# Patient Record
Sex: Female | Born: 1955 | Race: Black or African American | Hispanic: No | Marital: Single | Smoking: Never smoker
Health system: Southern US, Community
[De-identification: ages and names within clinical notes are randomized; demographics above are authoritative.]

## PROBLEM LIST (undated history)

## (undated) DIAGNOSIS — F32A Depression, unspecified: Secondary | ICD-10-CM

## (undated) DIAGNOSIS — E785 Hyperlipidemia, unspecified: Secondary | ICD-10-CM

## (undated) DIAGNOSIS — E119 Type 2 diabetes mellitus without complications: Secondary | ICD-10-CM

## (undated) DIAGNOSIS — C50919 Malignant neoplasm of unspecified site of unspecified female breast: Secondary | ICD-10-CM

## (undated) DIAGNOSIS — C801 Malignant (primary) neoplasm, unspecified: Secondary | ICD-10-CM

## (undated) DIAGNOSIS — I10 Essential (primary) hypertension: Secondary | ICD-10-CM

## (undated) DIAGNOSIS — M1990 Unspecified osteoarthritis, unspecified site: Secondary | ICD-10-CM

## (undated) HISTORY — PX: BREAST BIOPSY: SHX20

## (undated) HISTORY — DX: Hyperlipidemia, unspecified: E78.5

## (undated) HISTORY — DX: Essential (primary) hypertension: I10

## (undated) HISTORY — PX: BREAST LUMPECTOMY: SHX2

## (undated) HISTORY — PX: TONSILLECTOMY: SUR1361

## (undated) HISTORY — DX: Malignant neoplasm of unspecified site of unspecified female breast: C50.919

## (undated) HISTORY — DX: Depression, unspecified: F32.A

## (undated) HISTORY — PX: KNEE ARTHROSCOPY: SHX127

## (undated) HISTORY — DX: Type 2 diabetes mellitus without complications: E11.9

## (undated) HISTORY — DX: Unspecified osteoarthritis, unspecified site: M19.90

---

## 2010-12-03 DIAGNOSIS — C50811 Malignant neoplasm of overlapping sites of right female breast: Secondary | ICD-10-CM | POA: Insufficient documentation

## 2019-05-28 ENCOUNTER — Other Ambulatory Visit: Payer: Self-pay

## 2019-05-28 DIAGNOSIS — Z20822 Contact with and (suspected) exposure to covid-19: Secondary | ICD-10-CM

## 2019-05-30 LAB — NOVEL CORONAVIRUS, NAA: SARS-CoV-2, NAA: NOT DETECTED

## 2019-12-24 ENCOUNTER — Emergency Department
Admission: EM | Admit: 2019-12-24 | Discharge: 2019-12-24 | Disposition: A | Discharge status: Home / Self Care | Payer: BLUE CROSS/BLUE SHIELD | Attending: Emergency Medicine | Admitting: Emergency Medicine

## 2019-12-24 ENCOUNTER — Other Ambulatory Visit: Payer: Self-pay

## 2019-12-24 ENCOUNTER — Encounter: Payer: Self-pay | Admitting: Emergency Medicine

## 2019-12-24 ENCOUNTER — Emergency Department: Payer: BLUE CROSS/BLUE SHIELD

## 2019-12-24 DIAGNOSIS — R519 Headache, unspecified: Secondary | ICD-10-CM | POA: Insufficient documentation

## 2019-12-24 DIAGNOSIS — R42 Dizziness and giddiness: Secondary | ICD-10-CM | POA: Insufficient documentation

## 2019-12-24 HISTORY — DX: Malignant (primary) neoplasm, unspecified: C80.1

## 2019-12-24 LAB — DIFFERENTIAL
Abs Immature Granulocytes: 0.06 10*3/uL (ref 0.00–0.07)
Basophils Absolute: 0 10*3/uL (ref 0.0–0.1)
Basophils Relative: 0 %
Eosinophils Absolute: 0.2 10*3/uL (ref 0.0–0.5)
Eosinophils Relative: 2 %
Immature Granulocytes: 1 %
Lymphocytes Relative: 32 %
Lymphs Abs: 3.5 10*3/uL (ref 0.7–4.0)
Monocytes Absolute: 0.8 10*3/uL (ref 0.1–1.0)
Monocytes Relative: 7 %
Neutro Abs: 6.5 10*3/uL (ref 1.7–7.7)
Neutrophils Relative %: 58 %

## 2019-12-24 LAB — GLUCOSE, CAPILLARY
Glucose-Capillary: 192 mg/dL — ABNORMAL HIGH (ref 70–99)
Glucose-Capillary: 128 mg/dL — ABNORMAL HIGH (ref 70–99)

## 2019-12-24 LAB — COMPREHENSIVE METABOLIC PANEL
ALT: 25 U/L (ref 0–44)
AST: 22 U/L (ref 15–41)
Albumin: 3.8 g/dL (ref 3.5–5.0)
Alkaline Phosphatase: 86 U/L (ref 38–126)
Anion gap: 8 (ref 5–15)
BUN: 14 mg/dL (ref 8–23)
CO2: 23 mmol/L (ref 22–32)
Calcium: 8.4 mg/dL — ABNORMAL LOW (ref 8.9–10.3)
Chloride: 106 mmol/L (ref 98–111)
Creatinine, Ser: 1 mg/dL (ref 0.44–1.00)
GFR calc Af Amer: >60 mL/min (ref >60)
GFR calc non Af Amer: 59 mL/min — ABNORMAL LOW (ref >60)
Glucose, Bld: 200 mg/dL — ABNORMAL HIGH (ref 70–99)
Potassium: 3.6 mmol/L (ref 3.5–5.1)
Sodium: 137 mmol/L (ref 135–145)
Total Bilirubin: 0.5 mg/dL (ref 0.3–1.2)
Total Protein: 7.4 g/dL (ref 6.5–8.1)

## 2019-12-24 LAB — APTT: aPTT: 25 seconds (ref 24–36)

## 2019-12-24 LAB — CBC
HCT: 44 % (ref 36.0–46.0)
Hemoglobin: 14.2 g/dL (ref 12.0–15.0)
MCH: 30.5 pg (ref 26.0–34.0)
MCHC: 32.3 g/dL (ref 30.0–36.0)
MCV: 94.6 fL (ref 80.0–100.0)
Platelets: 319 10*3/uL (ref 150–400)
RBC: 4.65 MIL/uL (ref 3.87–5.11)
RDW: 12.6 % (ref 11.5–15.5)
WBC: 10.9 10*3/uL — ABNORMAL HIGH (ref 4.0–10.5)
nRBC: 0 % (ref 0.0–0.2)

## 2019-12-24 LAB — PROTIME-INR
INR: 0.9 (ref 0.8–1.2)
Prothrombin Time: 12.4 seconds (ref 11.4–15.2)

## 2019-12-24 MED ORDER — MECLIZINE HCL 25 MG PO TABS
25.0000 mg | ORAL_TABLET | Freq: Once | ORAL | Status: AC
  Administered 2019-12-24: 25 mg via ORAL
  Filled 2019-12-24: qty 1

## 2019-12-24 MED ORDER — MECLIZINE HCL 25 MG PO TABS: 25.0000 mg | ORAL_TABLET | Freq: Three times a day (TID) | ORAL | 0 refills | Status: AC | PRN

## 2019-12-24 MED ORDER — IBUPROFEN 400 MG PO TABS
400.0000 mg | ORAL_TABLET | Freq: Once | ORAL | Status: AC
  Administered 2019-12-24: 400 mg via ORAL
  Filled 2019-12-24: qty 1

## 2019-12-24 MED ORDER — SODIUM CHLORIDE 0.9% FLUSH: 3.0000 mL | Freq: Once | INTRAVENOUS | Status: DC

## 2019-12-24 NOTE — ED Notes (Signed)
Pt reports improvement of dizziness following medication administration. Patient is resting comfortably in bed at this time and has no complaints or requests

## 2019-12-24 NOTE — ED Triage Notes (Signed)
Pt to ED via POV c/o "vertigo". Pt states that she does not have hx/o vertigo. Pt states that she got up yesterday morning and was sitting on the toilet. Pt states that the room started to spin, she felt nauseated, and got a headache. Pt states that when she stood up she was off balance. Pt reports taking Dramamine with improvement in the nausea and some improvement in the dizziness. Pt states that she still has a headache. Pt is in NAD at this time. Pt denies numbness or tingling. States that she did have some numbness on the left side of her face yesterday but that has since resolved, pt reports that numbness "did not last long". No slurred speech noted.

## 2019-12-24 NOTE — ED Provider Notes (Signed)
[facility] Emergency Department Provider Note  Time seen: 7:04 PM  I have reviewed the triage vital signs and the nursing notes.   HISTORY  Chief Complaint Dizziness   HPI Krista Collins is a 64 y.o. female presents to the emergency department for dizziness.  According to the patient yesterday she sat down on the toilet and had acute onset of dizziness which she describes as the room spinning around her.  Patient states she became quite nauseated as well.  States throughout the day had come and gone she states it gets worse anytime she moves her head or turns her head.  She took Dramamine today which has been helping with her symptoms, but she continues to have intermittent dizziness especially with head movement so she came to the emergency department for evaluation.  Patient denies any weakness or numbness of any arm or leg denies any slurred speech.  No history of vertigo previously.  Patient states mild headache as well.  Past Medical History:  Diagnosis Date  . Cancer ([place])     There are no problems to display for this patient.   Past Surgical History:  Procedure Laterality Date  . BREAST LUMPECTOMY Right     Prior to Admission medications   Not on File    No Known Allergies  No family history on file.  Social History Social History   Tobacco Use  . Smoking status: Never Smoker  . Smokeless tobacco: Never Used  Substance Use Topics  . Alcohol use: Not Currently  . Drug use: Not Currently    Review of Systems Constitutional: Negative for fever. Cardiovascular: Negative for chest pain. Respiratory: Negative for shortness of breath. Gastrointestinal: Negative for abdominal pain, intermittent nausea with dizziness symptoms Genitourinary: Negative for urinary compaints Musculoskeletal: Negative for musculoskeletal complaints Neurological: Negative for headache All other ROS  negative  ____________________________________________   PHYSICAL EXAM:  VITAL SIGNS: ED Triage Vitals  Enc Vitals Group     BP 12/24/19 1531 (!) 157/70     Pulse Rate 12/24/19 1531 (!) 103     Resp 12/24/19 1531 18     Temp 12/24/19 1531 98.7 F (37.1 C)     Temp Source 12/24/19 1531 Oral     SpO2 12/24/19 1531 98 %     Weight 12/24/19 1537 260 lb (117.9 kg)     Height 12/24/19 1537 5\' 7"  (1.702 m)     Head Circumference --      Peak Flow --      Pain Score 12/24/19 1536 5     Pain Loc --      Pain Edu? --      Excl. in [place]? --    Constitutional: Alert and oriented. Well appearing and in no distress. Eyes: Normal exam ENT      Head: Normocephalic and atraumatic.      Mouth/Throat: Mucous membranes are moist. Cardiovascular: Normal rate, regular rhythm.  Respiratory: Normal respiratory effort without tachypnea nor retractions. Breath sounds are clear Gastrointestinal: Soft and nontender. No distention. Musculoskeletal: Nontender with normal range of motion in all extremities.  Neurologic:  Normal speech and language.  No gross deficits.  Equal grip strength bilaterally.  No pronator drift.  No lower extremity drift.  5/5 motor in all extremities.  Intact finger-to-nose testing. Skin:  Skin is warm, dry and intact.  Psychiatric: Mood and affect are normal.  ____________________________________________    EKG  EKG viewed and interpreted by myself shows a normal sinus rhythm  100 bpm.  Narrow QRS, normal axis, normal intervals, no concerning ST changes.  ____________________________________________    RADIOLOGY  CT head negative  ____________________________________________   INITIAL IMPRESSION / ASSESSMENT AND PLAN / ED COURSE  Pertinent labs & imaging results that were available during my care of the patient were reviewed by me and considered in my medical decision making (see chart for details).   Patient presents to the emergency department for acute onset  of dizziness.  Patient states it started yesterday upon sitting down on the toilet.  Describes as a spinning sensation with the room spinning around her and feeling off balance if she is trying to walk.  States it mostly occurs when she turns her head.  Has been using Dramamine today which has been helping with her symptoms.  Denies any significant dizziness lying still in bed.  Symptoms are very suggestive of BPPV.  Patient's work-up is thus far reassuring including a negative CT scan.  Patient does have a history of arthritis and hypertension.  Does not appear to be on any medications for diabetes.  Never diagnosed with diabetes.  Blood glucose is somewhat elevated, possibly indicating new onset or borderline diabetes.  We will recheck a fingerstick.  Given the patient's reassuring work-up and neurologic exam I believe the patient will be safe for discharge home with meclizine  Patient states he feels much better after meclizine.  We will discharge the patient home with PCP follow-up.  I discussed return precautions.  Patient agreeable to plan of care.  Krista Collins was evaluated in Emergency Department on 12/24/2019 for the symptoms described in the history of present illness. She was evaluated in the context of the global COVID-19 pandemic, which necessitated consideration that the patient might be at risk for infection with the SARS-CoV-2 virus that causes COVID-19. Institutional protocols and algorithms that pertain to the evaluation of patients at risk for COVID-19 are in a state of rapid change based on information released by regulatory bodies including the CDC and federal and state organizations. These policies and algorithms were followed during the patient's care in the ED.  ____________________________________________   FINAL CLINICAL IMPRESSION(S) / ED DIAGNOSES  Dizziness Vertigo   Harvest Dark, MD 12/24/19 2009

## 2019-12-24 NOTE — ED Notes (Signed)
POC CBG completed. Result was 192. Pt stating that 2-3 years ago her primary doctor told her she was border-line diabetic.

## 2020-01-31 DIAGNOSIS — M1990 Unspecified osteoarthritis, unspecified site: Secondary | ICD-10-CM | POA: Insufficient documentation

## 2020-01-31 DIAGNOSIS — G5600 Carpal tunnel syndrome, unspecified upper limb: Secondary | ICD-10-CM | POA: Insufficient documentation

## 2020-01-31 DIAGNOSIS — I10 Essential (primary) hypertension: Secondary | ICD-10-CM | POA: Insufficient documentation

## 2020-10-16 DIAGNOSIS — Z96652 Presence of left artificial knee joint: Secondary | ICD-10-CM | POA: Diagnosis not present

## 2020-10-19 DIAGNOSIS — Z96652 Presence of left artificial knee joint: Secondary | ICD-10-CM | POA: Diagnosis not present

## 2020-10-23 DIAGNOSIS — Z96652 Presence of left artificial knee joint: Secondary | ICD-10-CM | POA: Diagnosis not present

## 2020-11-06 DIAGNOSIS — Z96652 Presence of left artificial knee joint: Secondary | ICD-10-CM | POA: Diagnosis not present

## 2020-11-07 DIAGNOSIS — Z09 Encounter for follow-up examination after completed treatment for conditions other than malignant neoplasm: Secondary | ICD-10-CM | POA: Diagnosis not present

## 2020-11-07 DIAGNOSIS — Z471 Aftercare following joint replacement surgery: Secondary | ICD-10-CM | POA: Diagnosis not present

## 2020-11-07 DIAGNOSIS — Z96652 Presence of left artificial knee joint: Secondary | ICD-10-CM | POA: Diagnosis not present

## 2020-11-07 DIAGNOSIS — M21162 Varus deformity, not elsewhere classified, left knee: Secondary | ICD-10-CM | POA: Diagnosis not present

## 2020-11-15 DIAGNOSIS — Z96652 Presence of left artificial knee joint: Secondary | ICD-10-CM | POA: Diagnosis not present

## 2020-11-21 DIAGNOSIS — Z96652 Presence of left artificial knee joint: Secondary | ICD-10-CM | POA: Diagnosis not present

## 2020-11-24 DIAGNOSIS — Z96652 Presence of left artificial knee joint: Secondary | ICD-10-CM | POA: Diagnosis not present

## 2020-11-30 DIAGNOSIS — Z96652 Presence of left artificial knee joint: Secondary | ICD-10-CM | POA: Diagnosis not present

## 2020-12-11 DIAGNOSIS — M79604 Pain in right leg: Secondary | ICD-10-CM | POA: Diagnosis not present

## 2020-12-11 DIAGNOSIS — E119 Type 2 diabetes mellitus without complications: Secondary | ICD-10-CM | POA: Insufficient documentation

## 2020-12-11 DIAGNOSIS — E1140 Type 2 diabetes mellitus with diabetic neuropathy, unspecified: Secondary | ICD-10-CM | POA: Diagnosis not present

## 2020-12-11 DIAGNOSIS — R202 Paresthesia of skin: Secondary | ICD-10-CM | POA: Diagnosis not present

## 2020-12-11 DIAGNOSIS — L600 Ingrowing nail: Secondary | ICD-10-CM | POA: Diagnosis not present

## 2020-12-11 DIAGNOSIS — M79675 Pain in left toe(s): Secondary | ICD-10-CM | POA: Diagnosis not present

## 2020-12-11 DIAGNOSIS — M79674 Pain in right toe(s): Secondary | ICD-10-CM | POA: Diagnosis not present

## 2020-12-11 DIAGNOSIS — M545 Low back pain, unspecified: Secondary | ICD-10-CM | POA: Diagnosis not present

## 2020-12-11 DIAGNOSIS — B351 Tinea unguium: Secondary | ICD-10-CM | POA: Diagnosis not present

## 2020-12-11 DIAGNOSIS — Z780 Asymptomatic menopausal state: Secondary | ICD-10-CM | POA: Diagnosis not present

## 2020-12-13 DIAGNOSIS — M1711 Unilateral primary osteoarthritis, right knee: Secondary | ICD-10-CM | POA: Diagnosis not present

## 2021-01-04 DIAGNOSIS — Z96652 Presence of left artificial knee joint: Secondary | ICD-10-CM | POA: Diagnosis not present

## 2021-01-05 DIAGNOSIS — M9905 Segmental and somatic dysfunction of pelvic region: Secondary | ICD-10-CM | POA: Diagnosis not present

## 2021-01-05 DIAGNOSIS — M5431 Sciatica, right side: Secondary | ICD-10-CM | POA: Diagnosis not present

## 2021-01-05 DIAGNOSIS — M9903 Segmental and somatic dysfunction of lumbar region: Secondary | ICD-10-CM | POA: Diagnosis not present

## 2021-01-05 DIAGNOSIS — M5432 Sciatica, left side: Secondary | ICD-10-CM | POA: Diagnosis not present

## 2021-01-08 DIAGNOSIS — M5431 Sciatica, right side: Secondary | ICD-10-CM | POA: Diagnosis not present

## 2021-01-08 DIAGNOSIS — M9905 Segmental and somatic dysfunction of pelvic region: Secondary | ICD-10-CM | POA: Diagnosis not present

## 2021-01-08 DIAGNOSIS — M9903 Segmental and somatic dysfunction of lumbar region: Secondary | ICD-10-CM | POA: Diagnosis not present

## 2021-01-08 DIAGNOSIS — M5432 Sciatica, left side: Secondary | ICD-10-CM | POA: Diagnosis not present

## 2021-01-12 DIAGNOSIS — M9903 Segmental and somatic dysfunction of lumbar region: Secondary | ICD-10-CM | POA: Diagnosis not present

## 2021-01-12 DIAGNOSIS — M5431 Sciatica, right side: Secondary | ICD-10-CM | POA: Diagnosis not present

## 2021-01-12 DIAGNOSIS — M5432 Sciatica, left side: Secondary | ICD-10-CM | POA: Diagnosis not present

## 2021-01-12 DIAGNOSIS — M9905 Segmental and somatic dysfunction of pelvic region: Secondary | ICD-10-CM | POA: Diagnosis not present

## 2021-01-24 DIAGNOSIS — M5432 Sciatica, left side: Secondary | ICD-10-CM | POA: Diagnosis not present

## 2021-01-24 DIAGNOSIS — M9903 Segmental and somatic dysfunction of lumbar region: Secondary | ICD-10-CM | POA: Diagnosis not present

## 2021-01-24 DIAGNOSIS — M9905 Segmental and somatic dysfunction of pelvic region: Secondary | ICD-10-CM | POA: Diagnosis not present

## 2021-01-24 DIAGNOSIS — M5431 Sciatica, right side: Secondary | ICD-10-CM | POA: Diagnosis not present

## 2021-01-25 DIAGNOSIS — Z780 Asymptomatic menopausal state: Secondary | ICD-10-CM | POA: Diagnosis not present

## 2021-01-25 DIAGNOSIS — R202 Paresthesia of skin: Secondary | ICD-10-CM | POA: Diagnosis not present

## 2021-01-25 DIAGNOSIS — E119 Type 2 diabetes mellitus without complications: Secondary | ICD-10-CM | POA: Diagnosis not present

## 2021-01-25 DIAGNOSIS — E559 Vitamin D deficiency, unspecified: Secondary | ICD-10-CM | POA: Diagnosis not present

## 2021-01-29 DIAGNOSIS — M9905 Segmental and somatic dysfunction of pelvic region: Secondary | ICD-10-CM | POA: Diagnosis not present

## 2021-01-29 DIAGNOSIS — M9903 Segmental and somatic dysfunction of lumbar region: Secondary | ICD-10-CM | POA: Diagnosis not present

## 2021-01-29 DIAGNOSIS — M5432 Sciatica, left side: Secondary | ICD-10-CM | POA: Diagnosis not present

## 2021-01-29 DIAGNOSIS — Z124 Encounter for screening for malignant neoplasm of cervix: Secondary | ICD-10-CM | POA: Diagnosis not present

## 2021-01-29 DIAGNOSIS — M5431 Sciatica, right side: Secondary | ICD-10-CM | POA: Diagnosis not present

## 2021-01-29 DIAGNOSIS — Z0000 Encounter for general adult medical examination without abnormal findings: Secondary | ICD-10-CM | POA: Diagnosis not present

## 2021-01-29 DIAGNOSIS — E1142 Type 2 diabetes mellitus with diabetic polyneuropathy: Secondary | ICD-10-CM | POA: Diagnosis not present

## 2021-02-02 DIAGNOSIS — M9905 Segmental and somatic dysfunction of pelvic region: Secondary | ICD-10-CM | POA: Diagnosis not present

## 2021-02-02 DIAGNOSIS — M9903 Segmental and somatic dysfunction of lumbar region: Secondary | ICD-10-CM | POA: Diagnosis not present

## 2021-02-02 DIAGNOSIS — M5432 Sciatica, left side: Secondary | ICD-10-CM | POA: Diagnosis not present

## 2021-02-02 DIAGNOSIS — M5431 Sciatica, right side: Secondary | ICD-10-CM | POA: Diagnosis not present

## 2021-02-12 IMAGING — CT CT HEAD W/O CM
3 series · 16 of 47 positions shown, 19 images · non-contrast
Comparison: None.

CLINICAL DATA: Dizziness, nausea

EXAM:
CT HEAD WITHOUT CONTRAST
TECHNIQUE: Contiguous axial images were obtained from the base of the skull
through the vertex without intravenous contrast.

[Series 2: head wo · axial · 0.43mm/px · z∈[+283,+408]mm · 10 of 30 slices shown, 13 images]
[im 3/30  brain]
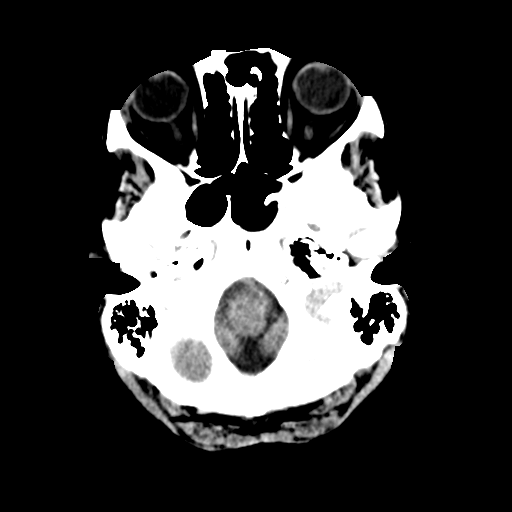
[im 3/30  bone]
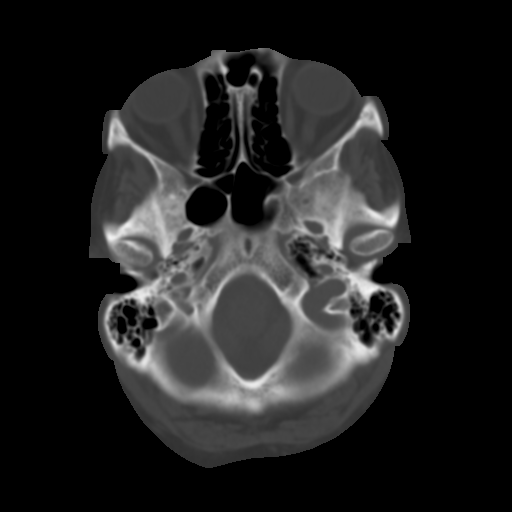
[im 6/30  brain]
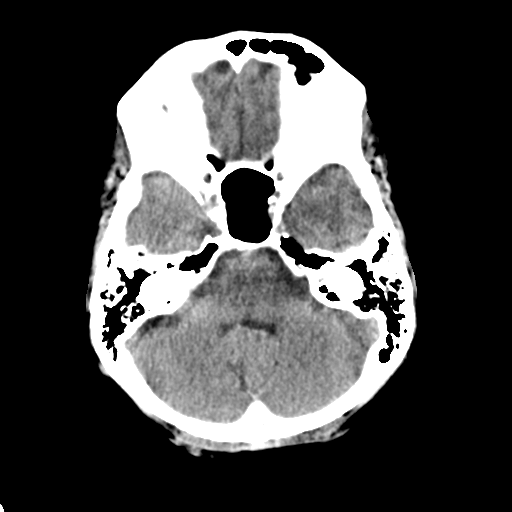
[im 9/30  brain]
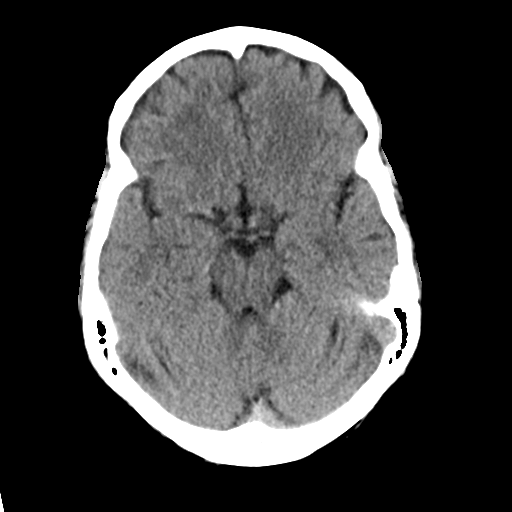
[im 11/30  brain]
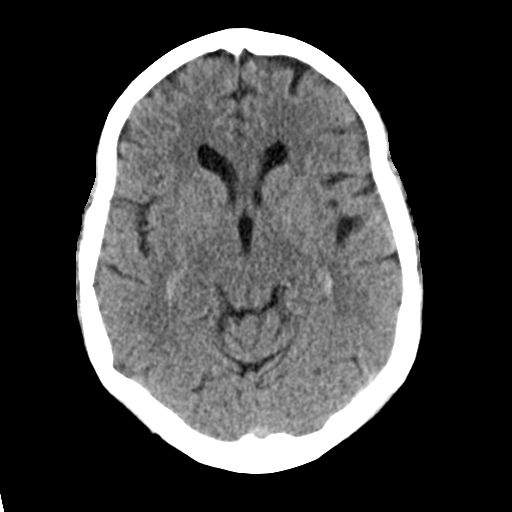
[im 14/30  brain]
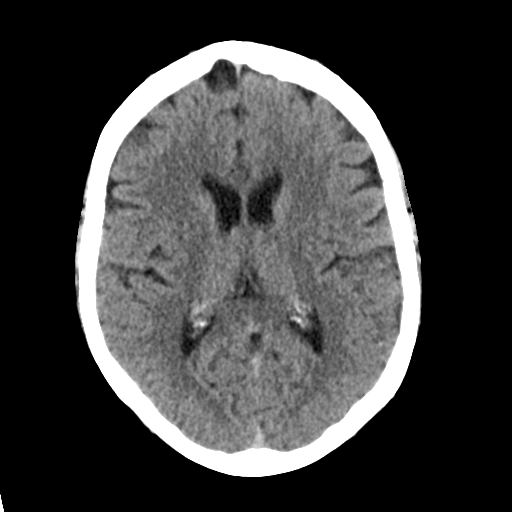
[im 14/30  bone]
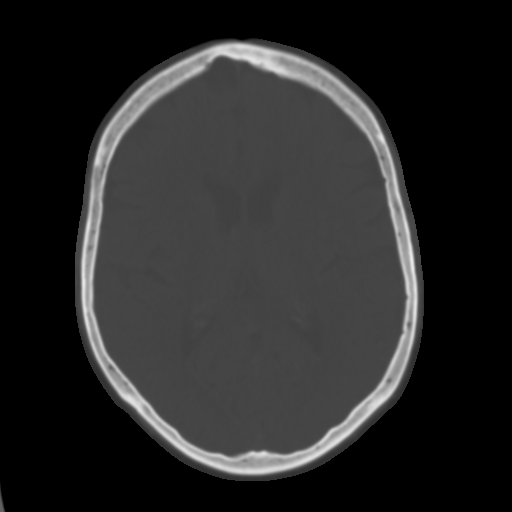
[im 17/30  brain]
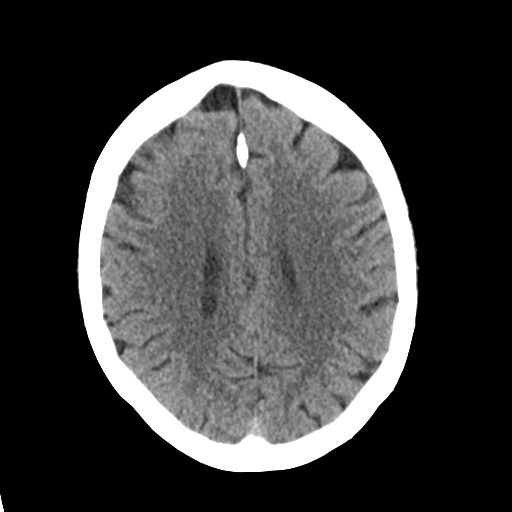
[im 20/30  brain]
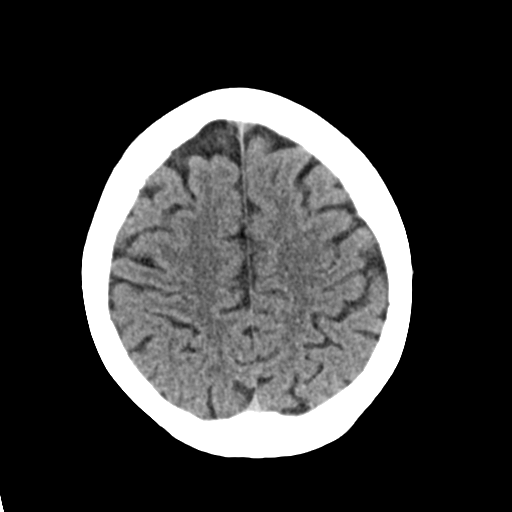
[im 23/30  brain]
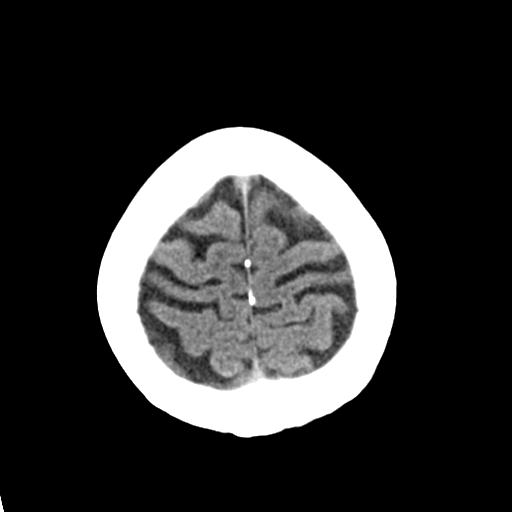
[im 25/30  brain]
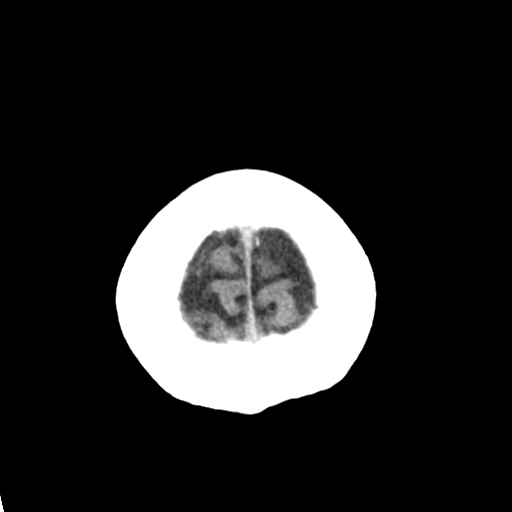
[im 25/30  bone]
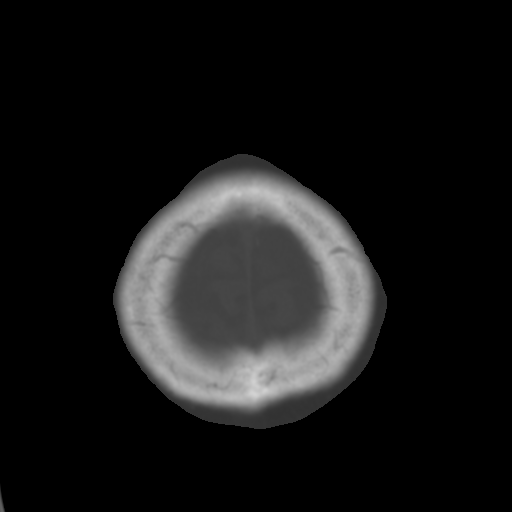
[im 28/30  brain]
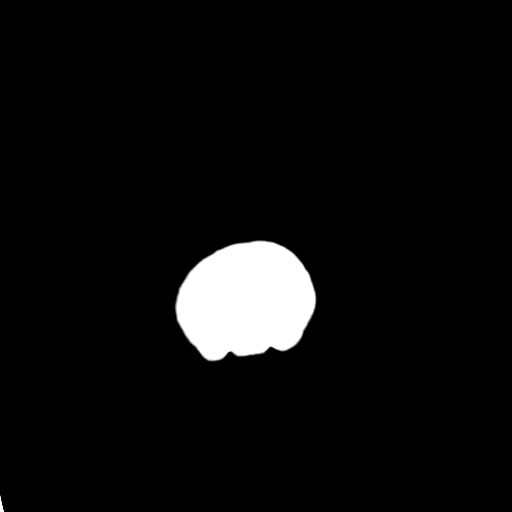

[Series 4: coronal soft tissue · coronal · 0.30mm/px · 3 of 67 slices shown]
[im 23/67  brain]
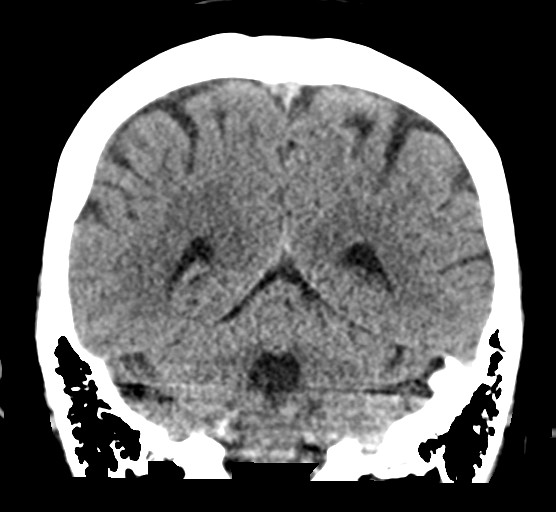
[im 30/67  brain]
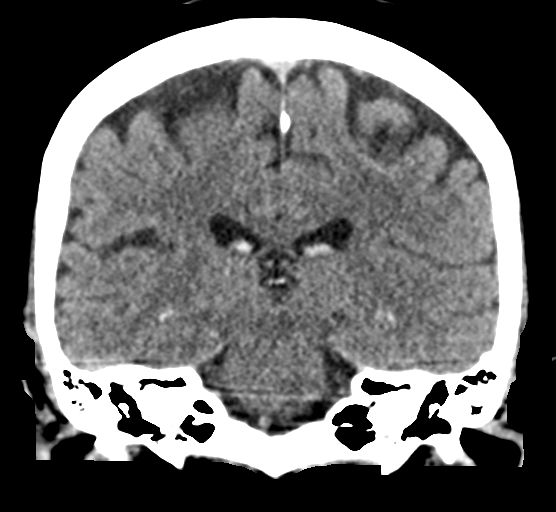
[im 37/67  brain]
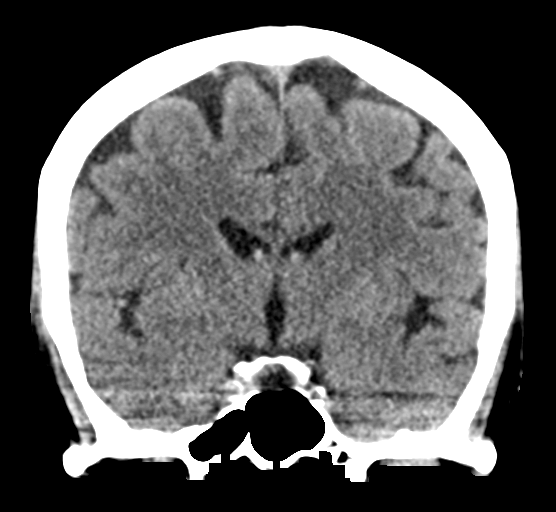

[Series 5: sagittal soft tissue · sagittal · 0.29mm/px · 3 of 61 slices shown]
[im 21/61  brain]
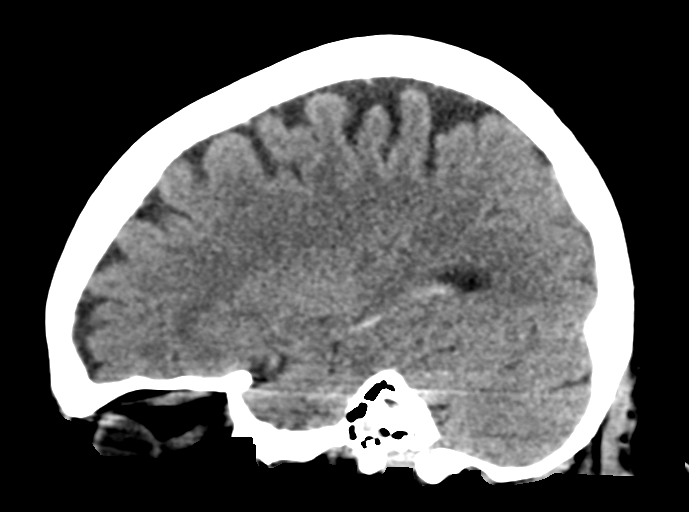
[im 31/61  brain]
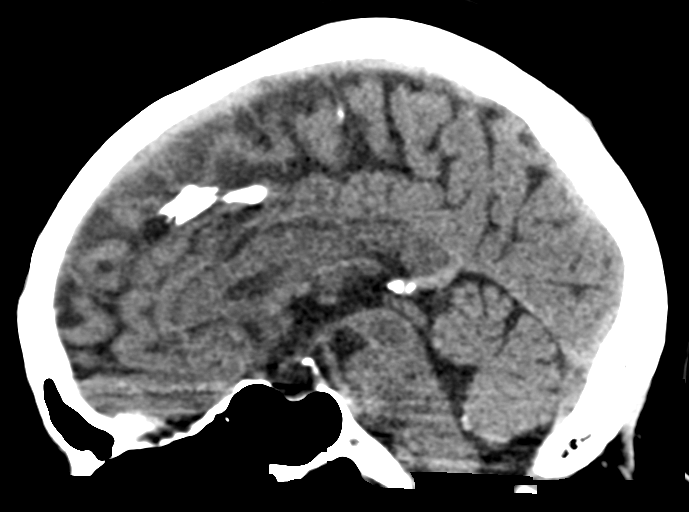
[im 41/61  brain]
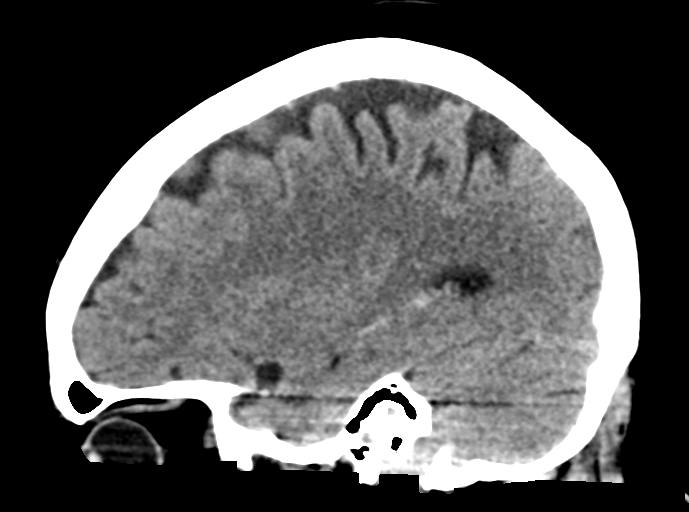

[16 of 47 positions shown; findings below may reference images not displayed]

FINDINGS: Brain: No evidence of acute infarction, hemorrhage, hydrocephalus,
extra-axial collection or mass lesion/mass effect.

Vascular: No hyperdense vessel or unexpected calcification.

Skull: Normal. Negative for fracture or focal lesion.

Sinuses/Orbits: No acute finding.

Other: None.
IMPRESSION: No acute intracranial pathology.

## 2021-02-15 DIAGNOSIS — F324 Major depressive disorder, single episode, in partial remission: Secondary | ICD-10-CM | POA: Diagnosis not present

## 2021-02-15 DIAGNOSIS — R600 Localized edema: Secondary | ICD-10-CM | POA: Diagnosis not present

## 2021-03-06 DIAGNOSIS — Z20822 Contact with and (suspected) exposure to covid-19: Secondary | ICD-10-CM | POA: Diagnosis not present

## 2021-03-13 DIAGNOSIS — L600 Ingrowing nail: Secondary | ICD-10-CM | POA: Diagnosis not present

## 2021-03-13 DIAGNOSIS — L03032 Cellulitis of left toe: Secondary | ICD-10-CM | POA: Diagnosis not present

## 2021-03-13 DIAGNOSIS — E1140 Type 2 diabetes mellitus with diabetic neuropathy, unspecified: Secondary | ICD-10-CM | POA: Diagnosis not present

## 2021-03-13 DIAGNOSIS — B351 Tinea unguium: Secondary | ICD-10-CM | POA: Diagnosis not present

## 2021-03-16 DIAGNOSIS — M1711 Unilateral primary osteoarthritis, right knee: Secondary | ICD-10-CM | POA: Diagnosis not present

## 2021-04-18 DIAGNOSIS — H524 Presbyopia: Secondary | ICD-10-CM | POA: Diagnosis not present

## 2021-04-18 DIAGNOSIS — Z0100 Encounter for examination of eyes and vision without abnormal findings: Secondary | ICD-10-CM | POA: Diagnosis not present

## 2021-06-14 DIAGNOSIS — M1711 Unilateral primary osteoarthritis, right knee: Secondary | ICD-10-CM | POA: Diagnosis not present

## 2021-07-02 DIAGNOSIS — Z23 Encounter for immunization: Secondary | ICD-10-CM | POA: Diagnosis not present

## 2021-07-02 DIAGNOSIS — M1711 Unilateral primary osteoarthritis, right knee: Secondary | ICD-10-CM | POA: Diagnosis not present

## 2021-07-02 DIAGNOSIS — J0100 Acute maxillary sinusitis, unspecified: Secondary | ICD-10-CM | POA: Diagnosis not present

## 2021-07-12 DIAGNOSIS — Z1231 Encounter for screening mammogram for malignant neoplasm of breast: Secondary | ICD-10-CM | POA: Diagnosis not present

## 2021-08-07 DIAGNOSIS — G8929 Other chronic pain: Secondary | ICD-10-CM | POA: Diagnosis not present

## 2021-08-07 DIAGNOSIS — M25561 Pain in right knee: Secondary | ICD-10-CM | POA: Diagnosis not present

## 2021-08-07 DIAGNOSIS — M1711 Unilateral primary osteoarthritis, right knee: Secondary | ICD-10-CM | POA: Insufficient documentation

## 2021-08-07 DIAGNOSIS — Z96652 Presence of left artificial knee joint: Secondary | ICD-10-CM | POA: Diagnosis not present

## 2021-10-02 DIAGNOSIS — Z6838 Body mass index (BMI) 38.0-38.9, adult: Secondary | ICD-10-CM | POA: Diagnosis not present

## 2021-10-02 DIAGNOSIS — Z5181 Encounter for therapeutic drug level monitoring: Secondary | ICD-10-CM | POA: Diagnosis not present

## 2021-10-02 DIAGNOSIS — R7309 Other abnormal glucose: Secondary | ICD-10-CM | POA: Diagnosis not present

## 2021-10-02 DIAGNOSIS — M1711 Unilateral primary osteoarthritis, right knee: Secondary | ICD-10-CM | POA: Diagnosis not present

## 2021-10-02 DIAGNOSIS — Z7901 Long term (current) use of anticoagulants: Secondary | ICD-10-CM | POA: Diagnosis not present

## 2021-10-02 DIAGNOSIS — Z01818 Encounter for other preprocedural examination: Secondary | ICD-10-CM | POA: Diagnosis not present

## 2021-10-18 DIAGNOSIS — M1711 Unilateral primary osteoarthritis, right knee: Secondary | ICD-10-CM | POA: Insufficient documentation

## 2021-10-19 DIAGNOSIS — M1711 Unilateral primary osteoarthritis, right knee: Secondary | ICD-10-CM | POA: Diagnosis not present

## 2021-10-22 DIAGNOSIS — M1711 Unilateral primary osteoarthritis, right knee: Secondary | ICD-10-CM | POA: Diagnosis not present

## 2021-10-22 DIAGNOSIS — Z923 Personal history of irradiation: Secondary | ICD-10-CM | POA: Diagnosis not present

## 2021-10-22 DIAGNOSIS — E119 Type 2 diabetes mellitus without complications: Secondary | ICD-10-CM | POA: Diagnosis not present

## 2021-10-22 DIAGNOSIS — Z853 Personal history of malignant neoplasm of breast: Secondary | ICD-10-CM | POA: Diagnosis not present

## 2021-10-22 DIAGNOSIS — Z96651 Presence of right artificial knee joint: Secondary | ICD-10-CM | POA: Diagnosis not present

## 2021-10-22 DIAGNOSIS — Z8616 Personal history of COVID-19: Secondary | ICD-10-CM | POA: Diagnosis not present

## 2021-10-22 DIAGNOSIS — Z7982 Long term (current) use of aspirin: Secondary | ICD-10-CM | POA: Diagnosis not present

## 2021-10-22 DIAGNOSIS — Z79899 Other long term (current) drug therapy: Secondary | ICD-10-CM | POA: Diagnosis not present

## 2021-10-22 DIAGNOSIS — I10 Essential (primary) hypertension: Secondary | ICD-10-CM | POA: Diagnosis not present

## 2021-10-22 DIAGNOSIS — Z96652 Presence of left artificial knee joint: Secondary | ICD-10-CM | POA: Diagnosis not present

## 2021-10-23 DIAGNOSIS — E119 Type 2 diabetes mellitus without complications: Secondary | ICD-10-CM | POA: Diagnosis not present

## 2021-10-23 DIAGNOSIS — Z853 Personal history of malignant neoplasm of breast: Secondary | ICD-10-CM | POA: Diagnosis not present

## 2021-10-23 DIAGNOSIS — I10 Essential (primary) hypertension: Secondary | ICD-10-CM | POA: Diagnosis not present

## 2021-10-23 DIAGNOSIS — Z8616 Personal history of COVID-19: Secondary | ICD-10-CM | POA: Diagnosis not present

## 2021-10-23 DIAGNOSIS — M1711 Unilateral primary osteoarthritis, right knee: Secondary | ICD-10-CM | POA: Diagnosis not present

## 2021-10-23 DIAGNOSIS — Z7982 Long term (current) use of aspirin: Secondary | ICD-10-CM | POA: Diagnosis not present

## 2021-10-23 DIAGNOSIS — Z923 Personal history of irradiation: Secondary | ICD-10-CM | POA: Diagnosis not present

## 2021-10-23 DIAGNOSIS — Z79899 Other long term (current) drug therapy: Secondary | ICD-10-CM | POA: Diagnosis not present

## 2021-10-23 DIAGNOSIS — E1165 Type 2 diabetes mellitus with hyperglycemia: Secondary | ICD-10-CM | POA: Diagnosis not present

## 2021-10-23 DIAGNOSIS — Z96652 Presence of left artificial knee joint: Secondary | ICD-10-CM | POA: Diagnosis not present

## 2021-10-24 DIAGNOSIS — E119 Type 2 diabetes mellitus without complications: Secondary | ICD-10-CM | POA: Diagnosis not present

## 2021-10-24 DIAGNOSIS — Z7982 Long term (current) use of aspirin: Secondary | ICD-10-CM | POA: Diagnosis not present

## 2021-10-24 DIAGNOSIS — Z853 Personal history of malignant neoplasm of breast: Secondary | ICD-10-CM | POA: Diagnosis not present

## 2021-10-24 DIAGNOSIS — I10 Essential (primary) hypertension: Secondary | ICD-10-CM | POA: Diagnosis not present

## 2021-10-24 DIAGNOSIS — Z79899 Other long term (current) drug therapy: Secondary | ICD-10-CM | POA: Diagnosis not present

## 2021-10-24 DIAGNOSIS — M1711 Unilateral primary osteoarthritis, right knee: Secondary | ICD-10-CM | POA: Diagnosis not present

## 2021-10-24 DIAGNOSIS — Z8616 Personal history of COVID-19: Secondary | ICD-10-CM | POA: Diagnosis not present

## 2021-10-24 DIAGNOSIS — Z923 Personal history of irradiation: Secondary | ICD-10-CM | POA: Diagnosis not present

## 2021-10-24 DIAGNOSIS — Z96652 Presence of left artificial knee joint: Secondary | ICD-10-CM | POA: Diagnosis not present

## 2021-11-13 DIAGNOSIS — M1711 Unilateral primary osteoarthritis, right knee: Secondary | ICD-10-CM | POA: Diagnosis not present

## 2021-11-16 DIAGNOSIS — M1711 Unilateral primary osteoarthritis, right knee: Secondary | ICD-10-CM | POA: Diagnosis not present

## 2021-11-20 DIAGNOSIS — M1711 Unilateral primary osteoarthritis, right knee: Secondary | ICD-10-CM | POA: Diagnosis not present

## 2021-11-23 DIAGNOSIS — M1711 Unilateral primary osteoarthritis, right knee: Secondary | ICD-10-CM | POA: Diagnosis not present

## 2021-11-28 DIAGNOSIS — M1711 Unilateral primary osteoarthritis, right knee: Secondary | ICD-10-CM | POA: Diagnosis not present

## 2021-12-04 DIAGNOSIS — Z471 Aftercare following joint replacement surgery: Secondary | ICD-10-CM | POA: Diagnosis not present

## 2021-12-04 DIAGNOSIS — Z96651 Presence of right artificial knee joint: Secondary | ICD-10-CM | POA: Diagnosis not present

## 2021-12-05 DIAGNOSIS — M1711 Unilateral primary osteoarthritis, right knee: Secondary | ICD-10-CM | POA: Diagnosis not present

## 2021-12-07 DIAGNOSIS — M1711 Unilateral primary osteoarthritis, right knee: Secondary | ICD-10-CM | POA: Diagnosis not present

## 2021-12-11 DIAGNOSIS — M1711 Unilateral primary osteoarthritis, right knee: Secondary | ICD-10-CM | POA: Diagnosis not present

## 2022-01-30 ENCOUNTER — Other Ambulatory Visit: Payer: Self-pay | Admitting: Internal Medicine

## 2022-01-30 DIAGNOSIS — E1140 Type 2 diabetes mellitus with diabetic neuropathy, unspecified: Secondary | ICD-10-CM | POA: Diagnosis not present

## 2022-01-30 DIAGNOSIS — M5416 Radiculopathy, lumbar region: Secondary | ICD-10-CM

## 2022-01-30 DIAGNOSIS — Z1389 Encounter for screening for other disorder: Secondary | ICD-10-CM | POA: Diagnosis not present

## 2022-01-30 DIAGNOSIS — E559 Vitamin D deficiency, unspecified: Secondary | ICD-10-CM | POA: Diagnosis not present

## 2022-01-30 DIAGNOSIS — Z780 Asymptomatic menopausal state: Secondary | ICD-10-CM | POA: Diagnosis not present

## 2022-01-30 DIAGNOSIS — E538 Deficiency of other specified B group vitamins: Secondary | ICD-10-CM | POA: Diagnosis not present

## 2022-01-30 DIAGNOSIS — F324 Major depressive disorder, single episode, in partial remission: Secondary | ICD-10-CM | POA: Diagnosis not present

## 2022-01-30 DIAGNOSIS — Z0000 Encounter for general adult medical examination without abnormal findings: Secondary | ICD-10-CM | POA: Diagnosis not present

## 2022-02-07 ENCOUNTER — Ambulatory Visit: Payer: Medicare HMO

## 2022-02-25 DIAGNOSIS — E119 Type 2 diabetes mellitus without complications: Secondary | ICD-10-CM | POA: Diagnosis not present

## 2022-02-25 DIAGNOSIS — I10 Essential (primary) hypertension: Secondary | ICD-10-CM | POA: Diagnosis not present

## 2022-02-25 DIAGNOSIS — M5021 Other cervical disc displacement,  high cervical region: Secondary | ICD-10-CM | POA: Diagnosis not present

## 2022-02-25 DIAGNOSIS — M4800 Spinal stenosis, site unspecified: Secondary | ICD-10-CM | POA: Diagnosis not present

## 2022-02-25 DIAGNOSIS — G8929 Other chronic pain: Secondary | ICD-10-CM | POA: Diagnosis not present

## 2022-02-25 DIAGNOSIS — M9971 Connective tissue and disc stenosis of intervertebral foramina of cervical region: Secondary | ICD-10-CM | POA: Diagnosis not present

## 2022-02-25 DIAGNOSIS — M47812 Spondylosis without myelopathy or radiculopathy, cervical region: Secondary | ICD-10-CM | POA: Diagnosis not present

## 2022-02-25 DIAGNOSIS — M4726 Other spondylosis with radiculopathy, lumbar region: Secondary | ICD-10-CM | POA: Diagnosis not present

## 2022-02-25 DIAGNOSIS — M5441 Lumbago with sciatica, right side: Secondary | ICD-10-CM | POA: Diagnosis not present

## 2022-02-25 DIAGNOSIS — M5124 Other intervertebral disc displacement, thoracic region: Secondary | ICD-10-CM | POA: Diagnosis not present

## 2022-02-25 DIAGNOSIS — M5416 Radiculopathy, lumbar region: Secondary | ICD-10-CM | POA: Diagnosis not present

## 2022-02-25 DIAGNOSIS — M25559 Pain in unspecified hip: Secondary | ICD-10-CM | POA: Diagnosis not present

## 2022-03-16 DIAGNOSIS — C50919 Malignant neoplasm of unspecified site of unspecified female breast: Secondary | ICD-10-CM | POA: Diagnosis not present

## 2022-04-24 DIAGNOSIS — M4726 Other spondylosis with radiculopathy, lumbar region: Secondary | ICD-10-CM | POA: Diagnosis not present

## 2022-04-24 DIAGNOSIS — R1312 Dysphagia, oropharyngeal phase: Secondary | ICD-10-CM | POA: Diagnosis not present

## 2022-04-24 DIAGNOSIS — E119 Type 2 diabetes mellitus without complications: Secondary | ICD-10-CM | POA: Diagnosis not present

## 2022-04-29 DIAGNOSIS — Z6838 Body mass index (BMI) 38.0-38.9, adult: Secondary | ICD-10-CM | POA: Diagnosis not present

## 2022-04-29 DIAGNOSIS — G8929 Other chronic pain: Secondary | ICD-10-CM | POA: Diagnosis not present

## 2022-04-29 DIAGNOSIS — M5410 Radiculopathy, site unspecified: Secondary | ICD-10-CM | POA: Diagnosis not present

## 2022-04-29 DIAGNOSIS — M533 Sacrococcygeal disorders, not elsewhere classified: Secondary | ICD-10-CM | POA: Diagnosis not present

## 2022-05-06 DIAGNOSIS — M4726 Other spondylosis with radiculopathy, lumbar region: Secondary | ICD-10-CM | POA: Diagnosis not present

## 2022-05-06 DIAGNOSIS — M9973 Connective tissue and disc stenosis of intervertebral foramina of lumbar region: Secondary | ICD-10-CM | POA: Diagnosis not present

## 2022-05-06 DIAGNOSIS — M5116 Intervertebral disc disorders with radiculopathy, lumbar region: Secondary | ICD-10-CM | POA: Diagnosis not present

## 2022-05-06 DIAGNOSIS — M48061 Spinal stenosis, lumbar region without neurogenic claudication: Secondary | ICD-10-CM | POA: Diagnosis not present

## 2022-05-15 DIAGNOSIS — M533 Sacrococcygeal disorders, not elsewhere classified: Secondary | ICD-10-CM | POA: Diagnosis not present

## 2022-05-27 ENCOUNTER — Other Ambulatory Visit: Payer: Self-pay | Admitting: Family Medicine

## 2022-05-27 DIAGNOSIS — Z136 Encounter for screening for cardiovascular disorders: Secondary | ICD-10-CM

## 2022-05-27 DIAGNOSIS — Z87891 Personal history of nicotine dependence: Secondary | ICD-10-CM

## 2022-07-12 DIAGNOSIS — M533 Sacrococcygeal disorders, not elsewhere classified: Secondary | ICD-10-CM | POA: Diagnosis not present

## 2022-07-15 DIAGNOSIS — Z1231 Encounter for screening mammogram for malignant neoplasm of breast: Secondary | ICD-10-CM | POA: Diagnosis not present

## 2022-07-18 DIAGNOSIS — E119 Type 2 diabetes mellitus without complications: Secondary | ICD-10-CM | POA: Diagnosis not present

## 2022-07-25 DIAGNOSIS — E119 Type 2 diabetes mellitus without complications: Secondary | ICD-10-CM | POA: Diagnosis not present

## 2022-07-25 DIAGNOSIS — J069 Acute upper respiratory infection, unspecified: Secondary | ICD-10-CM | POA: Diagnosis not present

## 2022-08-02 DIAGNOSIS — Z6838 Body mass index (BMI) 38.0-38.9, adult: Secondary | ICD-10-CM | POA: Diagnosis not present

## 2022-08-02 DIAGNOSIS — M479 Spondylosis, unspecified: Secondary | ICD-10-CM | POA: Diagnosis not present

## 2022-09-19 DIAGNOSIS — M47816 Spondylosis without myelopathy or radiculopathy, lumbar region: Secondary | ICD-10-CM | POA: Diagnosis not present

## 2022-10-18 DIAGNOSIS — M479 Spondylosis, unspecified: Secondary | ICD-10-CM | POA: Diagnosis not present

## 2022-11-05 DIAGNOSIS — Z853 Personal history of malignant neoplasm of breast: Secondary | ICD-10-CM | POA: Diagnosis not present

## 2022-11-05 DIAGNOSIS — E119 Type 2 diabetes mellitus without complications: Secondary | ICD-10-CM | POA: Diagnosis not present

## 2022-11-05 DIAGNOSIS — M5416 Radiculopathy, lumbar region: Secondary | ICD-10-CM | POA: Diagnosis not present

## 2023-01-24 ENCOUNTER — Telehealth: Payer: Self-pay | Admitting: *Deleted

## 2023-01-24 ENCOUNTER — Encounter: Payer: Self-pay | Admitting: *Deleted

## 2023-01-24 NOTE — Patient Outreach (Signed)
  Care Coordination   Initial Visit Note   01/24/2023 Name: Krista Collins MRN: [id number] DOB: 1956-07-12  Krista Collins is a 67 y.o. year old female who sees Patient, No Pcp Per for primary care. I spoke with  Krista Collins by phone today.  What matters to the patients health and wellness today?  Bringing A1C down from 7.2. On Ozempic, concerned about cost once she reaches the donut hole, will discuss using Monjarno with provider instead.    Goals Addressed             This Visit's Progress    Effective management of DM       Care Coordination Interventions: Provided education to patient about basic DM disease process Reviewed medications with patient and discussed importance of medication adherence Counseled on importance of regular laboratory monitoring as prescribed Discussed plans with patient for ongoing care management follow up and provided patient with direct contact information for care management team Provided patient with written educational materials related to hypo and hyperglycemia and importance of correct treatment Reviewed scheduled/upcoming provider appointments including: labs on 4/17 and PCP on 4/26 Screening for signs and symptoms of depression related to chronic disease state  Assessed social determinant of health barriers         SDOH assessments and interventions completed:  Yes  SDOH Interventions Today    Flowsheet Row Most Recent Value  SDOH Interventions   Food Insecurity Interventions Intervention Not Indicated  Housing Interventions Intervention Not Indicated  Transportation Interventions Intervention Not Indicated        Care Coordination Interventions:  Yes, provided   Interventions Today    Flowsheet Row Most Recent Value  Chronic Disease   Chronic disease during today's visit Diabetes  General Interventions   General Interventions Discussed/Reviewed General Interventions Discussed, Annual Foot Exam, Annual Eye Exam, Labs,  Doctor Visits  Labs Hgb A1c every 3 months  Doctor Visits Discussed/Reviewed Doctor Visits Discussed, PCP  PCP/Specialist Visits Compliance with follow-up visit  Education Interventions   Education Provided Provided Education  Provided Verbal Education On Nutrition, Foot Care, Eye Care, Labs, Blood Sugar Monitoring, Medication, When to see the doctor  Labs Reviewed Hgb A1c  Nutrition Interventions   Nutrition Discussed/Reviewed Nutrition Discussed, Carbohydrate meal planning, Adding fruits and vegetables, Decreasing sugar intake        Follow up plan: Follow up call scheduled for 5/1    Encounter Outcome:  Pt. Visit Completed   Kemper Durie, RN, MSN, [facility] Care Management Care Management Coordinator [phone]

## 2023-01-24 NOTE — Patient Instructions (Signed)
Visit Information  Thank you for taking time to visit with me today. Please don't hesitate to contact me if I can be of assistance to you before our next scheduled telephone appointment.  Following are the goals we discussed today:  Increase exercise and water intake, decrease carbs and sugars in diet.   Our next appointment is by telephone on 5/1  Please call the care guide team at [phone] if you need to cancel or reschedule your appointment.   Please call the Suicide and Crisis Lifeline: 988 call the [place] National Suicide Prevention Lifeline: 870-483-9159 or TTY: 801-758-2290 TTY 332-407-9886) to talk to a trained counselor call 1-800-273-TALK (toll free, 24 hour hotline) call 911 if you are experiencing a Mental Health or Behavioral Health Crisis or need someone to talk to.  The patient verbalized understanding of instructions, educational materials, and care plan provided today and agreed to receive a mailed copy of patient instructions, educational materials, and care plan.   The patient has been provided with contact information for the care management team and has been advised to call with any health related questions or concerns.   Kemper Durie, RN, MSN, [facility] Care Management Care Management Coordinator [phone]

## 2023-02-03 DIAGNOSIS — E119 Type 2 diabetes mellitus without complications: Secondary | ICD-10-CM | POA: Diagnosis not present

## 2023-02-03 DIAGNOSIS — M1711 Unilateral primary osteoarthritis, right knee: Secondary | ICD-10-CM | POA: Diagnosis not present

## 2023-02-07 DIAGNOSIS — Z1331 Encounter for screening for depression: Secondary | ICD-10-CM | POA: Diagnosis not present

## 2023-02-07 DIAGNOSIS — F324 Major depressive disorder, single episode, in partial remission: Secondary | ICD-10-CM | POA: Diagnosis not present

## 2023-02-07 DIAGNOSIS — Z0000 Encounter for general adult medical examination without abnormal findings: Secondary | ICD-10-CM | POA: Diagnosis not present

## 2023-02-07 DIAGNOSIS — Z23 Encounter for immunization: Secondary | ICD-10-CM | POA: Diagnosis not present

## 2023-02-07 DIAGNOSIS — E1140 Type 2 diabetes mellitus with diabetic neuropathy, unspecified: Secondary | ICD-10-CM | POA: Diagnosis not present

## 2023-02-12 ENCOUNTER — Ambulatory Visit: Payer: Self-pay | Admitting: *Deleted

## 2023-02-12 NOTE — Patient Outreach (Signed)
  Care Coordination   Follow Up Visit Note   02/12/2023 Name: Joey Hudock MRN: [id number] DOB: 29-Sep-1956  Larena Ohnemus is a 67 y.o. year old female who sees Patient, No Pcp Per for primary care. I spoke with  [name] by phone today.  What matters to the patients health and wellness today?  Patient report she is with a client, unable to talk and will call back.     SDOH assessments and interventions completed:  No     Care Coordination Interventions:  No, not indicated   Follow up plan:  pending call back.  If no call back, will have care guide to reschedule.     Encounter Outcome:  Pt. Request to Call Back   Kemper Durie, RN, MSN, [facility] Care Management Care Management Coordinator [phone]

## 2023-03-06 DIAGNOSIS — H2513 Age-related nuclear cataract, bilateral: Secondary | ICD-10-CM | POA: Diagnosis not present

## 2023-03-06 DIAGNOSIS — E119 Type 2 diabetes mellitus without complications: Secondary | ICD-10-CM | POA: Diagnosis not present

## 2023-03-06 DIAGNOSIS — Z0100 Encounter for examination of eyes and vision without abnormal findings: Secondary | ICD-10-CM | POA: Diagnosis not present

## 2023-03-07 ENCOUNTER — Ambulatory Visit: Payer: Self-pay | Admitting: *Deleted

## 2023-03-07 NOTE — Patient Outreach (Signed)
  Care Coordination   Follow Up Visit Note   03/07/2023 Name: Dashanna Hornbacher MRN: [id number] DOB: 20-Dec-1955  Takala Giauque is a 67 y.o. year old female who sees Patient, No Pcp Per for primary care. I spoke with  [name] by phone today.  What matters to the patients health and wellness today?  Continue good DM management and decrease cost of medications    Goals Addressed             This Visit's Progress    Effective management of DM   On track    Care Coordination Interventions: Provided education to patient about basic DM disease process Reviewed medications with patient and discussed importance of medication adherence Counseled on importance of regular laboratory monitoring as prescribed Discussed plans with patient for ongoing care management follow up and provided patient with direct contact information for care management team Provided patient with written educational materials related to hypo and hyperglycemia and importance of correct treatment         SDOH assessments and interventions completed:  No     Care Coordination Interventions:  Yes, provided    Interventions Today    Flowsheet Row Most Recent Value  Chronic Disease   Chronic disease during today's visit Diabetes  General Interventions   General Interventions Discussed/Reviewed General Interventions Reviewed, Labs, Doctor Visits, Communication with  Labs Hgb A1c every 3 months  Doctor Visits Discussed/Reviewed Doctor Visits Reviewed, PCP, Specialist  [PCP 7/26]  PCP/Specialist Visits Compliance with follow-up visit  Communication with Pharmacists  [Referral placed to pharmacy team for medication assistance]  Education Interventions   Education Provided Provided Education  Provided Verbal Education On Medication, Blood Sugar Monitoring  [State blood sugars are "good" and will continue to monitor]  Pharmacy Interventions   Pharmacy Dicussed/Reviewed Affording Medications, Pharmacy Topics  Reviewed  [Will change to [facility] when refill needed due to cost.  State Trellegy is also expensive, requesting medication assistance.]       Follow up plan: Follow up call scheduled for 6/21    Encounter Outcome:  Pt. Visit Completed   Kemper Durie, RN, MSN, [facility] Care Management Care Management Coordinator [phone]

## 2023-03-13 ENCOUNTER — Telehealth: Payer: Self-pay | Admitting: Pharmacist

## 2023-03-13 NOTE — Progress Notes (Addendum)
[facility]) Care Management  [facility]   03/13/2023  Retha Harmes 1956-08-30 [id number]  Reason for referral: medication assistance  Referral source: [facility] CM Referral medication(s): Trelegy and Mounjaro Current insurance:[employer] HMO  Medication Review Findings:  Patient's medications on [facility] are not updated. (Refer to the med list from [facility])  Medication Assistance Findings:  Medication assistance needs identified: Patient was called HIPAA identifiers were obtained.  Patient confirmed she needs assistance affording Trelegy and Mounjaro.  Unfortunately, Greggory Keen is not available through patient assistance programs.  However, if deemed therapeutically appropriate, Ozempic could be substituted for [facility] as it is available through [employer].    The Patient reported that she is still on Ozempic and only asked for [facility] because she was told it would be cheaper.  Dr. Prudencio Pair office was called to inquire about the therapeutic substitution. A message was left.    Trelegy is available through [employer] but the Patient reported she has not spent the required $600 in out of pocket medication expenses to qualify for the program.     Additional medication assistance options reviewed with patient as warranted:  No other options identified  Plan: Await an answer from Dr. Wiliam Ke. Once permission for the switch is given. Patient assistance forms will be mailed to the patient.  Patient communicated understanding about the necessary financial paperwork required.  Beecher Mcardle, PharmD, BCACP [facility] Clinical Pharmacist [phone]   [facility]-  Dr. Janalyn Harder nurse called back and approved the switch to Ozempic.    Plan: I will route patient assistance letter to [facility] pharmacy technician who will coordinate patient assistance program application process for medications listed above.  [facility] pharmacy technician will  assist with obtaining all required documents from both patient and provider(s) and submit application(s) once completed.

## 2023-03-19 DIAGNOSIS — M47817 Spondylosis without myelopathy or radiculopathy, lumbosacral region: Secondary | ICD-10-CM | POA: Diagnosis not present

## 2023-03-19 DIAGNOSIS — M47816 Spondylosis without myelopathy or radiculopathy, lumbar region: Secondary | ICD-10-CM | POA: Diagnosis not present

## 2023-03-19 DIAGNOSIS — M479 Spondylosis, unspecified: Secondary | ICD-10-CM | POA: Diagnosis not present

## 2023-03-20 ENCOUNTER — Telehealth: Payer: Self-pay | Admitting: Pharmacy Technician

## 2023-03-20 DIAGNOSIS — Z5986 Financial insecurity: Secondary | ICD-10-CM

## 2023-03-20 NOTE — Progress Notes (Signed)
[facility] Customer service manager [facility])                                            [facility] Quality Pharmacy Team    03/20/2023  Krista Collins 1956/04/30 [id number]                                      Medication Assistance Referral  Referral From: [facility] Katina B.  Medication/Company: Trelegy / [employer] Patient application portion:  Mailed Provider application portion: Faxed  to Dr. Enid Baas Provider address/fax verified via: Office website  Medication/Company: Franki Monte / [employer] Patient application portion:  Mining engineer portion: Faxed  to Dr. Enid Baas Provider address/fax verified via: Office website   Krista Collins P. Christophor Eick, CPhT [facility] [employer]  [phone]

## 2023-04-04 ENCOUNTER — Ambulatory Visit: Payer: Self-pay | Admitting: *Deleted

## 2023-04-04 NOTE — Patient Outreach (Signed)
  Care Coordination   Follow Up Visit Note   04/04/2023 Name: Krista Collins MRN: [id number] DOB: [birth year]  Krista Collins is a 67 y.o. year old female who sees Krista Baas, MD for primary care. I spoke with  Krista Collins by phone today.  What matters to the patients health and wellness today?  Relief of tingling feet and keep DM controlled    Goals Addressed             This Visit's Progress    Effective management of DM   On track    Care Coordination Interventions: Provided education to patient about basic DM disease process Reviewed medications with patient and discussed importance of medication adherence Counseled on importance of regular laboratory monitoring as prescribed Discussed plans with patient for ongoing care management follow up and provided patient with direct contact information for care management team Provided patient with written educational materials related to hypo and hyperglycemia and importance of correct treatment         SDOH assessments and interventions completed:  No     Care Coordination Interventions:  Yes, provided   Interventions Today    Flowsheet Row Most Recent Value  Chronic Disease   Chronic disease during today's visit Diabetes, Other  [tingling/burning of feet]  General Interventions   General Interventions Discussed/Reviewed General Interventions Reviewed, Doctor Visits, Labs  [Report she was told tingling/burning was not neuropathy but instead an issue with her spine.  Already active with spine specialist, has had injections that worked in the past, discussion about "burning" nerves for more relief]  Labs Hgb A1c every 3 months  [Will have repeat labs on 7/19]  Doctor Visits Discussed/Reviewed Doctor Visits Reviewed, PCP  [PCP on 7/26]  PCP/Specialist Visits Compliance with follow-up visit  Education Interventions   Education Provided Provided Education  Provided Verbal Education On Blood Sugar Monitoring,  Medication  [Encouraged to monitor blood sugar on a regular basis.  Confirms she has been in contact with pharmacy team for medication assistance for Ozempic and Trellegy.]       Follow up plan: Follow up call scheduled for 7/29    Encounter Outcome:  Pt. Visit Completed   Kemper Durie, RN, MSN, [facility] Care Management Care Management Coordinator [phone]

## 2023-05-02 DIAGNOSIS — E1140 Type 2 diabetes mellitus with diabetic neuropathy, unspecified: Secondary | ICD-10-CM | POA: Diagnosis not present

## 2023-05-09 DIAGNOSIS — M5416 Radiculopathy, lumbar region: Secondary | ICD-10-CM | POA: Diagnosis not present

## 2023-05-09 DIAGNOSIS — R221 Localized swelling, mass and lump, neck: Secondary | ICD-10-CM | POA: Diagnosis not present

## 2023-05-09 DIAGNOSIS — E1140 Type 2 diabetes mellitus with diabetic neuropathy, unspecified: Secondary | ICD-10-CM | POA: Diagnosis not present

## 2023-05-09 DIAGNOSIS — Z853 Personal history of malignant neoplasm of breast: Secondary | ICD-10-CM | POA: Diagnosis not present

## 2023-05-12 ENCOUNTER — Ambulatory Visit: Payer: Self-pay | Admitting: *Deleted

## 2023-05-12 NOTE — Patient Outreach (Signed)
  Care Coordination   Follow Up Visit Note   05/12/2023 Name: Krista Collins MRN: [id number] DOB: 01/29/1956  Krista Collins is a 67 y.o. year old female who sees Krista Baas, MD for primary care. I spoke with  Krista Collins by phone today.  What matters to the patients health and wellness today?  Get patient assistance for Ozempic and find out why neck is swelling, causing discomfort.    Goals Addressed             This Visit's Progress    Effective management of DM   On track    Care Coordination Interventions: Provided education to patient about basic DM disease process Reviewed medications with patient and discussed importance of medication adherence Counseled on importance of regular laboratory monitoring as prescribed Discussed plans with patient for ongoing care management follow up and provided patient with direct contact information for care management team Provided patient with written educational materials related to hypo and hyperglycemia and importance of correct treatment         SDOH assessments and interventions completed:  No     Care Coordination Interventions:  Yes, provided   Interventions Today    Flowsheet Row Most Recent Value  Chronic Disease   Chronic disease during today's visit Diabetes, Other  [neuropathy/spinal issues]  General Interventions   General Interventions Discussed/Reviewed General Interventions Reviewed, Labs, Doctor Visits  Labs Hgb A1c every 3 months  [A1C down to 6.3]  Doctor Visits Discussed/Reviewed Doctor Visits Reviewed, PCP, Specialist  Krista Collins 8/21, has had trouble with neck discomfort and intermittent trouble swallowing]  PCP/Specialist Visits Compliance with follow-up visit  Education Interventions   Education Provided Provided Education  Provided Verbal Education On Medication, Blood Sugar Monitoring, When to see the doctor  [Taking Gabapentin for chronic pain and neuropathy, report it is worse when she  misses doses.  Will work to stay on track.]  Pharmacy Interventions   Pharmacy Dicussed/Reviewed Affording Medications, Pharmacy Topics Reviewed  [Has applications for medication assistance for Ozempic, has not completed it, state she has been overwhelmed but will complete and submit]       Follow up plan: Follow up call scheduled for 8/27    Encounter Outcome:  Pt. Visit Completed   Kemper Durie, RN, MSN, [facility] Care Management Care Management Coordinator [phone]

## 2023-06-04 DIAGNOSIS — R221 Localized swelling, mass and lump, neck: Secondary | ICD-10-CM | POA: Diagnosis not present

## 2023-06-06 ENCOUNTER — Telehealth: Payer: Self-pay | Admitting: Pharmacy Technician

## 2023-06-06 DIAGNOSIS — Z5986 Financial insecurity: Secondary | ICD-10-CM

## 2023-06-06 NOTE — Progress Notes (Signed)
[facility] Customer service manager [facility])                                            [facility] Quality Pharmacy Team    06/06/2023  Krista Collins Feb 16, 1956 [id number]  Incoming call received from patient in regard to [employer] application for [employer] and Multimedia programmer for [employer]. Will re open the case as patient returned my call. GSK application for Trelegy will NOT be pursued as patient has not met the out of pocket requirement.  HIPAA verified with patient.  Patient had questions about the applications. Informed patient that the tax return is the proof of income preferred by both patient assistance foundations. Informed that if income is not able to be verified electronically then return would be needed. Also informed for the GSK application that proof of 600 dollars in prescriptions out of pocket would have to be proven with a printout from the pharmacy. Patient informs she has NOT spent the required 600 and would not send in that application unless she meets the requirement. She informs she will send back the [employer] for [employer] with tax return and copy of Hum[employer] card and place in mail by end of the weekend.  Pattricia Boss, CPhT [place]  [facility] Office: [phone] Fax: [phone] Email: [email or web address]

## 2023-06-10 ENCOUNTER — Ambulatory Visit: Payer: Self-pay | Admitting: *Deleted

## 2023-06-10 NOTE — Patient Outreach (Signed)
  Care Coordination   Follow Up Visit Note   06/10/2023 Name: Krista Collins MRN: [id number] DOB: 11-Oct-1956  Krista Collins is a 67 y.o. year old female who sees Enid Baas, MD for primary care. I spoke with  [name] by phone today.  What matters to the patients health and wellness today?  Ongoing health management, state ultrasound of neck mass was normal     Goals Addressed             This Visit's Progress    Effective management of DM   On track    Care Coordination Interventions: Provided education to patient about basic DM disease process Reviewed medications with patient and discussed importance of medication adherence Counseled on importance of regular laboratory monitoring as prescribed Discussed plans with patient for ongoing care management follow up and provided patient with direct contact information for care management team Provided patient with written educational materials related to hypo and hyperglycemia and importance of correct treatment         SDOH assessments and interventions completed:  No     Care Coordination Interventions:  Yes, provided   Interventions Today    Flowsheet Row Most Recent Value  Chronic Disease   Chronic disease during today's visit Diabetes  General Interventions   General Interventions Discussed/Reviewed General Interventions Reviewed, Doctor Visits  Doctor Visits Discussed/Reviewed Doctor Visits Reviewed, PCP, Specialist  [spine specialist 10/11, PCP 11/22]  PCP/Specialist Visits Compliance with follow-up visit  Education Interventions   Education Provided Provided Education  Provided Verbal Education On Nutrition, Labs, Medication  [Still taking Gabapentin for spine pain, states being more active helps with pain control]  Labs Reviewed Hgb A1c  [A1C decreased to 6.3]  Pharmacy Interventions   Pharmacy Dicussed/Reviewed Affording Medications, Pharmacy Topics Reviewed  [Working with Noreene Larsson to get coverage  for Ozempic and Trelegy]       Follow up plan: Follow up call scheduled for 10/7    Encounter Outcome:  Pt. Visit Completed   Kemper Durie, RN, MSN, [facility] Care Management Care Management Coordinator [phone]

## 2023-06-19 ENCOUNTER — Telehealth: Payer: Self-pay | Admitting: Pharmacy Technician

## 2023-06-19 DIAGNOSIS — Z5986 Financial insecurity: Secondary | ICD-10-CM

## 2023-06-19 NOTE — Progress Notes (Signed)
[facility])                                            [facility] Quality Pharmacy Team    06/19/2023  Krista Collins 10-27-1955 [id number]  Received both patient and provider portion(s) of patient assistance application(s) for Ozempic. Faxed completed application and required documents into [employer].   Pattricia Boss, CPhT [place]  Office: [phone] Fax: [phone] Email: [email or web address]

## 2023-06-23 ENCOUNTER — Telehealth: Payer: Self-pay | Admitting: Pharmacy Technician

## 2023-06-23 DIAGNOSIS — Z5986 Financial insecurity: Secondary | ICD-10-CM

## 2023-06-23 NOTE — Progress Notes (Signed)
[facility] Customer service manager [facility])                                            [facility] Quality Pharmacy Team    06/23/2023  Chere Heiken 06-29-1956 [id number]  Care coordination call placed to Novo Nordisk in regard to Ozempic application.  Spoke to [place] who informs patient is APPROVED 06/23/2023-10/14/2023. Medication and subsequent refills will auto ship to the prescriber's office until enrollment period ends. If patient feels current supply is not adequate until next shipment arrives, patient can call Novo Nordisk at [phone] to check on next shipment.  Pattricia Boss, CPhT   Office: [phone] Fax: [phone] Email: [email or web address]

## 2023-07-21 ENCOUNTER — Ambulatory Visit: Payer: Self-pay | Admitting: *Deleted

## 2023-07-21 NOTE — Patient Outreach (Signed)
Care Coordination   07/21/2023 Name: Renn Sitler MRN: [id number] DOB: 11-19-1955   Care Coordination Outreach Attempts:  An unsuccessful telephone outreach was attempted for a scheduled appointment today.  Follow Up Plan:  Additional outreach attempts will be made to offer the patient care coordination information and services.   Encounter Outcome:  No Answer   Care Coordination Interventions:  No, not indicated    Kemper Durie, RN, MSN, [facility] Care Management Care Management Coordinator [phone]

## 2023-07-28 ENCOUNTER — Telehealth: Payer: Self-pay | Admitting: Pharmacist

## 2023-07-28 NOTE — Progress Notes (Signed)
07/28/2023  Patient ID: Krista Collins, female   DOB: 1956/05/19, 67 y.o.   MRN: [id number]   Reason for referral: medication assistance  Referral source: Assistance Re-enrollement Referral medication(s): Ozempic Current insurance:Humana   Objective: No Known Allergies  Medications Reviewed Today     Reviewed by Beecher Mcardle, [facility] (Pharmacist) on 07/28/23 at 1444  Med List Status: <None>   Medication Order Taking? Sig Documenting Provider Last Dose Status Informant  albuterol (VENTOLIN HFA) 108 (90 Base) MCG/ACT inhaler [id number] Yes Inhale 2 puffs into the lungs every 6 (six) hours as needed. [provider] Taking Active   atorvastatin (LIPITOR) 20 MG tablet [id number] Yes Take 20 mg by mouth daily. [provider] Taking Active   Cyanocobalamin 2500 MCG CHEW [id number] Yes Chew 1 tablet by mouth daily. [provider] Taking Active   Fluticasone-Umeclidin-Vilant (TRELEGY ELLIPTA) 100-62.5-25 MCG/ACT AEPB [id number] Yes Inhale 1 puff into the lungs daily. [provider] Taking Active   gabapentin (NEURONTIN) 100 MG capsule [id number] Yes Take 100 mg by mouth 3 (three) times daily. [provider] Taking Active   gabapentin (NEURONTIN) 300 MG capsule [id number] Yes Take 300 mg by mouth 3 (three) times daily. [provider] Taking Active   glipiZIDE (GLUCOTROL XL) 5 MG 24 hr tablet [id number] Yes Take 5 mg by mouth daily. [provider] Taking Active   magnesium oxide (MAG-OX) 400 MG tablet [id number] Yes Take 400 mg by mouth daily. [provider] Taking Active   meclizine (ANTIVERT) 25 MG tablet [id number] Yes Take 1 tablet (25 mg total) by mouth 3 (three) times daily as needed for dizziness. Minna Antis, MD Taking Active   metFORMIN (GLUCOPHAGE-XR) 500 MG 24 hr tablet [id number] Yes Take 2 tablets by mouth daily with supper. [provider] Taking Active   methocarbamol (ROBAXIN) 500  MG tablet [id number] Yes Take 500 mg by mouth 2 (two) times daily as needed. [provider] Taking Active   metoprolol tartrate (LOPRESSOR) 25 MG tablet [id number] Yes Take 1 tablet by mouth 2 (two) times daily. [provider] Taking Active   omeprazole (PRILOSEC) 20 MG capsule [id number] Yes Take 20 mg by mouth daily. [provider] Taking Active   Semaglutide,0.25 or 0.5MG /DOS, 2 MG/3ML SOPN [id number] Yes Inject 0.5 mg into the skin once a week. [provider] Taking Active              Medication Assistance Findings:  Medication assistance needs identified: Patient was called regarding medication assistance. HIPAA identifiers were obtained. We helped her with Ozempic last year and she confirmed needing assistance for 2024.  She confirmed that she has already received enough Ozempic to last her through the end of the year.      HgA1c  13.11/24  7.6 4/24  6.3 7/24   Additional medication assistance options reviewed with patient as warranted:  No other options identified  Plan: I will route patient assistance letter to [facility] pharmacy technician who will coordinate patient assistance program application process for medications listed above.  [facility] pharmacy technician will assist with obtaining all required documents from both patient and provider(s) and submit application(s) once completed.    Beecher Mcardle, PharmD, BCACP [facility] Clinical Pharmacist [phone]

## 2023-07-29 ENCOUNTER — Telehealth: Payer: Self-pay | Admitting: *Deleted

## 2023-07-29 NOTE — Progress Notes (Unsigned)
Care Coordination Note  07/29/2023 Name: Krista Collins MRN: [id number] DOB: August 07, 1956  Krista Collins is a 67 y.o. year old female who is a primary care patient of Enid Baas, MD and is actively engaged with the care management team. I reached out to [name] by phone today to assist with re-scheduling a follow up visit with the RN Case Manager  Follow up plan: Unsuccessful telephone outreach attempt made. A HIPAA compliant phone message was left for the patient providing contact information and requesting a return call.   Burman Nieves, CCMA Care Coordination Care Guide Direct Dial: [phone]

## 2023-07-30 DIAGNOSIS — M479 Spondylosis, unspecified: Secondary | ICD-10-CM | POA: Diagnosis not present

## 2023-07-31 ENCOUNTER — Telehealth: Payer: Self-pay | Admitting: Pharmacy Technician

## 2023-07-31 DIAGNOSIS — Z5986 Financial insecurity: Secondary | ICD-10-CM

## 2023-07-31 NOTE — Progress Notes (Signed)
[facility])                                            [facility] Quality Pharmacy Team    07/31/2023  Miley Blanchett 29-Jun-1956 [id number]                                      Medication Assistance Referral  Referral From: [facility] Katina B.  Medication/Company: Franki Monte / [employer] Patient application portion:  Mining engineer portion: Faxed  to Dr. Enid Baas Provider address/fax verified via: Office website  Pattricia Boss, CPhT [place]  Office: [phone] Fax: [phone] Email: [email or web address]

## 2023-07-31 NOTE — Progress Notes (Signed)
Care Coordination Note  07/31/2023 Name: Latreva Bartnik MRN: [id number] DOB: 1956-06-29  Shalimar Chiaramonte is a 67 y.o. year old female who is a primary care patient of Enid Baas, MD and is actively engaged with the care management team. I reached out to [name] by phone today to assist with re-scheduling a follow up visit with the RN Case Manager  Follow up plan: We have been unable to make contact with the patient for follow up.   Burman Nieves, CCMA Care Coordination Care Guide Direct Dial: [phone]

## 2023-09-01 DIAGNOSIS — M47817 Spondylosis without myelopathy or radiculopathy, lumbosacral region: Secondary | ICD-10-CM | POA: Diagnosis not present

## 2023-09-01 DIAGNOSIS — M47816 Spondylosis without myelopathy or radiculopathy, lumbar region: Secondary | ICD-10-CM | POA: Diagnosis not present

## 2023-09-01 DIAGNOSIS — M48061 Spinal stenosis, lumbar region without neurogenic claudication: Secondary | ICD-10-CM | POA: Diagnosis not present

## 2023-09-05 ENCOUNTER — Other Ambulatory Visit: Payer: Self-pay | Admitting: Pharmacy Technician

## 2023-09-05 DIAGNOSIS — M5416 Radiculopathy, lumbar region: Secondary | ICD-10-CM | POA: Diagnosis not present

## 2023-09-05 DIAGNOSIS — Z5986 Financial insecurity: Secondary | ICD-10-CM

## 2023-09-05 DIAGNOSIS — R0789 Other chest pain: Secondary | ICD-10-CM | POA: Diagnosis not present

## 2023-09-05 DIAGNOSIS — E1140 Type 2 diabetes mellitus with diabetic neuropathy, unspecified: Secondary | ICD-10-CM | POA: Diagnosis not present

## 2023-09-05 DIAGNOSIS — R079 Chest pain, unspecified: Secondary | ICD-10-CM | POA: Diagnosis not present

## 2023-09-05 NOTE — Progress Notes (Signed)
Pharmacy Medication Assistance Program Note    09/05/2023  Patient ID: Krista Collins, female   DOB: 1956/02/13, 67 y.o.   MRN: [id number]     09/05/2023  Outreach Medication One  Manufacturer Medication One [employer] Drugs Ozempic  Dose of Ozempic 2mg /4ml  Type of Radiographer, therapeutic Assistance  Date Application Received From Patient 09/02/2023  Application Items Received From Patient Application;Proof of Income;Other  Date Application Received From Provider 08/13/2023  Date Application Submitted to Manufacturer 09/04/2023  Method Application Sent to Manufacturer Fax       Signature   Kristopher Glee [facility]  Office: [phone] Fax: [phone] Email: [email or web address]

## 2023-09-24 DIAGNOSIS — L600 Ingrowing nail: Secondary | ICD-10-CM | POA: Diagnosis not present

## 2023-09-24 DIAGNOSIS — L601 Onycholysis: Secondary | ICD-10-CM | POA: Diagnosis not present

## 2023-09-24 DIAGNOSIS — B351 Tinea unguium: Secondary | ICD-10-CM | POA: Diagnosis not present

## 2023-09-24 DIAGNOSIS — E1140 Type 2 diabetes mellitus with diabetic neuropathy, unspecified: Secondary | ICD-10-CM | POA: Diagnosis not present

## 2023-09-30 DIAGNOSIS — R0789 Other chest pain: Secondary | ICD-10-CM | POA: Diagnosis not present

## 2023-10-13 ENCOUNTER — Telehealth: Payer: Self-pay | Admitting: Pharmacy Technician

## 2023-10-13 DIAGNOSIS — Z5986 Financial insecurity: Secondary | ICD-10-CM

## 2023-10-13 NOTE — Progress Notes (Signed)
Pharmacy Medication Assistance Program Note    10/13/2023  Patient ID: Krista Collins, female   DOB: 08/26/1956, 67 y.o.   MRN: [id number]     09/05/2023 10/13/2023  Outreach Medication One  Manufacturer Medication One [employer] Drugs Ozempic   Dose of Ozempic 2mg /52ml   Type of Radiographer, therapeutic Assistance   Date Application Received From Patient 09/02/2023   Application Items Received From Patient Application;Proof of Income;Other   Date Application Received From Provider 08/13/2023   Date Application Submitted to Manufacturer 09/04/2023   Method Application Sent to Manufacturer Fax   Patient Assistance Determination  Approved  Approval Start Date  10/15/2023  Approval End Date  10/13/2024   Care coordination call placed to [employer] in regard to Ozempic. Per the IVR system, patient is APPROVED 10/15/23-10/13/24. Medication will auto fill and auto ship to prescriber's office based on last fill date in 2025. Patient may call Novo Nordisk at any time to check on next refill and ship dates by calling [phone].  Signature  Kristopher Glee [facility]  Office: [phone] Fax: [phone] Email: [email or web address]

## 2023-11-06 DIAGNOSIS — N6311 Unspecified lump in the right breast, upper outer quadrant: Secondary | ICD-10-CM | POA: Diagnosis not present

## 2023-11-06 DIAGNOSIS — Z853 Personal history of malignant neoplasm of breast: Secondary | ICD-10-CM | POA: Diagnosis not present

## 2023-11-06 DIAGNOSIS — N644 Mastodynia: Secondary | ICD-10-CM | POA: Diagnosis not present

## 2023-11-13 DIAGNOSIS — Z139 Encounter for screening, unspecified: Secondary | ICD-10-CM | POA: Diagnosis not present

## 2023-11-13 DIAGNOSIS — N950 Postmenopausal bleeding: Secondary | ICD-10-CM | POA: Diagnosis not present

## 2023-11-13 DIAGNOSIS — E119 Type 2 diabetes mellitus without complications: Secondary | ICD-10-CM | POA: Diagnosis not present

## 2023-11-13 DIAGNOSIS — Z170 Estrogen receptor positive status [ER+]: Secondary | ICD-10-CM | POA: Diagnosis not present

## 2023-11-13 DIAGNOSIS — Z7185 Encounter for immunization safety counseling: Secondary | ICD-10-CM | POA: Diagnosis not present

## 2023-11-13 DIAGNOSIS — Z113 Encounter for screening for infections with a predominantly sexual mode of transmission: Secondary | ICD-10-CM | POA: Diagnosis not present

## 2023-11-13 DIAGNOSIS — Z124 Encounter for screening for malignant neoplasm of cervix: Secondary | ICD-10-CM | POA: Diagnosis not present

## 2023-11-13 DIAGNOSIS — C50811 Malignant neoplasm of overlapping sites of right female breast: Secondary | ICD-10-CM | POA: Diagnosis not present

## 2023-11-13 DIAGNOSIS — E1140 Type 2 diabetes mellitus with diabetic neuropathy, unspecified: Secondary | ICD-10-CM | POA: Diagnosis not present

## 2023-11-19 DIAGNOSIS — Z853 Personal history of malignant neoplasm of breast: Secondary | ICD-10-CM | POA: Diagnosis not present

## 2023-11-19 DIAGNOSIS — M5416 Radiculopathy, lumbar region: Secondary | ICD-10-CM | POA: Diagnosis not present

## 2023-11-19 DIAGNOSIS — E1142 Type 2 diabetes mellitus with diabetic polyneuropathy: Secondary | ICD-10-CM | POA: Diagnosis not present

## 2023-11-20 DIAGNOSIS — M47816 Spondylosis without myelopathy or radiculopathy, lumbar region: Secondary | ICD-10-CM | POA: Diagnosis not present

## 2023-11-20 DIAGNOSIS — M47817 Spondylosis without myelopathy or radiculopathy, lumbosacral region: Secondary | ICD-10-CM | POA: Diagnosis not present

## 2023-11-26 DIAGNOSIS — N6311 Unspecified lump in the right breast, upper outer quadrant: Secondary | ICD-10-CM | POA: Diagnosis not present

## 2023-11-26 DIAGNOSIS — R928 Other abnormal and inconclusive findings on diagnostic imaging of breast: Secondary | ICD-10-CM | POA: Diagnosis not present

## 2023-11-26 DIAGNOSIS — Z853 Personal history of malignant neoplasm of breast: Secondary | ICD-10-CM | POA: Diagnosis not present

## 2023-11-26 DIAGNOSIS — C50911 Malignant neoplasm of unspecified site of right female breast: Secondary | ICD-10-CM | POA: Diagnosis not present

## 2023-11-26 DIAGNOSIS — N644 Mastodynia: Secondary | ICD-10-CM | POA: Diagnosis not present

## 2023-12-09 DIAGNOSIS — E1140 Type 2 diabetes mellitus with diabetic neuropathy, unspecified: Secondary | ICD-10-CM | POA: Diagnosis not present

## 2023-12-09 DIAGNOSIS — F324 Major depressive disorder, single episode, in partial remission: Secondary | ICD-10-CM | POA: Diagnosis not present

## 2023-12-16 DIAGNOSIS — R1900 Intra-abdominal and pelvic swelling, mass and lump, unspecified site: Secondary | ICD-10-CM | POA: Diagnosis not present

## 2023-12-16 DIAGNOSIS — Z170 Estrogen receptor positive status [ER+]: Secondary | ICD-10-CM | POA: Diagnosis not present

## 2023-12-16 DIAGNOSIS — N950 Postmenopausal bleeding: Secondary | ICD-10-CM | POA: Diagnosis not present

## 2023-12-16 DIAGNOSIS — C541 Malignant neoplasm of endometrium: Secondary | ICD-10-CM | POA: Diagnosis not present

## 2023-12-16 DIAGNOSIS — C50811 Malignant neoplasm of overlapping sites of right female breast: Secondary | ICD-10-CM | POA: Diagnosis not present

## 2023-12-17 DIAGNOSIS — Z170 Estrogen receptor positive status [ER+]: Secondary | ICD-10-CM | POA: Diagnosis not present

## 2023-12-17 DIAGNOSIS — C50911 Malignant neoplasm of unspecified site of right female breast: Secondary | ICD-10-CM | POA: Diagnosis not present

## 2023-12-17 DIAGNOSIS — C50811 Malignant neoplasm of overlapping sites of right female breast: Secondary | ICD-10-CM | POA: Diagnosis not present

## 2023-12-18 DIAGNOSIS — M479 Spondylosis, unspecified: Secondary | ICD-10-CM | POA: Diagnosis not present

## 2023-12-23 DIAGNOSIS — C50919 Malignant neoplasm of unspecified site of unspecified female breast: Secondary | ICD-10-CM | POA: Diagnosis not present

## 2024-01-05 DIAGNOSIS — Z7985 Long-term (current) use of injectable non-insulin antidiabetic drugs: Secondary | ICD-10-CM | POA: Diagnosis not present

## 2024-01-05 DIAGNOSIS — M47817 Spondylosis without myelopathy or radiculopathy, lumbosacral region: Secondary | ICD-10-CM | POA: Diagnosis not present

## 2024-01-05 DIAGNOSIS — M47816 Spondylosis without myelopathy or radiculopathy, lumbar region: Secondary | ICD-10-CM | POA: Diagnosis not present

## 2024-01-06 DIAGNOSIS — F3340 Major depressive disorder, recurrent, in remission, unspecified: Secondary | ICD-10-CM | POA: Diagnosis not present

## 2024-01-06 DIAGNOSIS — E1140 Type 2 diabetes mellitus with diabetic neuropathy, unspecified: Secondary | ICD-10-CM | POA: Diagnosis not present

## 2024-01-07 DIAGNOSIS — E0869 Diabetes mellitus due to underlying condition with other specified complication: Secondary | ICD-10-CM | POA: Diagnosis not present

## 2024-01-07 DIAGNOSIS — C541 Malignant neoplasm of endometrium: Secondary | ICD-10-CM | POA: Diagnosis not present

## 2024-01-13 DIAGNOSIS — Z01818 Encounter for other preprocedural examination: Secondary | ICD-10-CM | POA: Diagnosis not present

## 2024-01-21 DIAGNOSIS — C50811 Malignant neoplasm of overlapping sites of right female breast: Secondary | ICD-10-CM | POA: Diagnosis not present

## 2024-01-21 DIAGNOSIS — Z170 Estrogen receptor positive status [ER+]: Secondary | ICD-10-CM | POA: Diagnosis not present

## 2024-01-23 DIAGNOSIS — M1711 Unilateral primary osteoarthritis, right knee: Secondary | ICD-10-CM | POA: Diagnosis not present

## 2024-01-23 DIAGNOSIS — Z794 Long term (current) use of insulin: Secondary | ICD-10-CM | POA: Diagnosis not present

## 2024-01-23 DIAGNOSIS — F419 Anxiety disorder, unspecified: Secondary | ICD-10-CM | POA: Diagnosis not present

## 2024-01-23 DIAGNOSIS — I10 Essential (primary) hypertension: Secondary | ICD-10-CM | POA: Diagnosis not present

## 2024-01-23 DIAGNOSIS — C50919 Malignant neoplasm of unspecified site of unspecified female breast: Secondary | ICD-10-CM | POA: Diagnosis not present

## 2024-01-23 DIAGNOSIS — G8918 Other acute postprocedural pain: Secondary | ICD-10-CM | POA: Diagnosis not present

## 2024-01-23 DIAGNOSIS — Z8542 Personal history of malignant neoplasm of other parts of uterus: Secondary | ICD-10-CM | POA: Diagnosis not present

## 2024-01-23 DIAGNOSIS — E1140 Type 2 diabetes mellitus with diabetic neuropathy, unspecified: Secondary | ICD-10-CM | POA: Diagnosis not present

## 2024-01-23 DIAGNOSIS — N9489 Other specified conditions associated with female genital organs and menstrual cycle: Secondary | ICD-10-CM | POA: Diagnosis not present

## 2024-01-23 DIAGNOSIS — C541 Malignant neoplasm of endometrium: Secondary | ICD-10-CM | POA: Diagnosis not present

## 2024-01-23 DIAGNOSIS — N949 Unspecified condition associated with female genital organs and menstrual cycle: Secondary | ICD-10-CM | POA: Diagnosis not present

## 2024-01-23 DIAGNOSIS — E1165 Type 2 diabetes mellitus with hyperglycemia: Secondary | ICD-10-CM | POA: Diagnosis not present

## 2024-01-24 DIAGNOSIS — G8918 Other acute postprocedural pain: Secondary | ICD-10-CM | POA: Diagnosis not present

## 2024-01-24 DIAGNOSIS — E1140 Type 2 diabetes mellitus with diabetic neuropathy, unspecified: Secondary | ICD-10-CM | POA: Diagnosis not present

## 2024-01-24 DIAGNOSIS — E1165 Type 2 diabetes mellitus with hyperglycemia: Secondary | ICD-10-CM | POA: Diagnosis not present

## 2024-01-24 DIAGNOSIS — M1711 Unilateral primary osteoarthritis, right knee: Secondary | ICD-10-CM | POA: Diagnosis not present

## 2024-01-24 DIAGNOSIS — C541 Malignant neoplasm of endometrium: Secondary | ICD-10-CM | POA: Diagnosis not present

## 2024-01-24 DIAGNOSIS — Z794 Long term (current) use of insulin: Secondary | ICD-10-CM | POA: Diagnosis not present

## 2024-01-24 DIAGNOSIS — C50919 Malignant neoplasm of unspecified site of unspecified female breast: Secondary | ICD-10-CM | POA: Diagnosis not present

## 2024-01-24 DIAGNOSIS — F419 Anxiety disorder, unspecified: Secondary | ICD-10-CM | POA: Diagnosis not present

## 2024-01-25 DIAGNOSIS — M1711 Unilateral primary osteoarthritis, right knee: Secondary | ICD-10-CM | POA: Diagnosis not present

## 2024-01-25 DIAGNOSIS — G8918 Other acute postprocedural pain: Secondary | ICD-10-CM | POA: Diagnosis not present

## 2024-01-25 DIAGNOSIS — C541 Malignant neoplasm of endometrium: Secondary | ICD-10-CM | POA: Diagnosis not present

## 2024-01-25 DIAGNOSIS — F419 Anxiety disorder, unspecified: Secondary | ICD-10-CM | POA: Diagnosis not present

## 2024-01-25 DIAGNOSIS — E1140 Type 2 diabetes mellitus with diabetic neuropathy, unspecified: Secondary | ICD-10-CM | POA: Diagnosis not present

## 2024-01-25 DIAGNOSIS — C50919 Malignant neoplasm of unspecified site of unspecified female breast: Secondary | ICD-10-CM | POA: Diagnosis not present

## 2024-01-25 DIAGNOSIS — E1165 Type 2 diabetes mellitus with hyperglycemia: Secondary | ICD-10-CM | POA: Diagnosis not present

## 2024-01-25 DIAGNOSIS — Z794 Long term (current) use of insulin: Secondary | ICD-10-CM | POA: Diagnosis not present

## 2024-01-27 DIAGNOSIS — Z791 Long term (current) use of non-steroidal anti-inflammatories (NSAID): Secondary | ICD-10-CM | POA: Diagnosis not present

## 2024-01-27 DIAGNOSIS — C50811 Malignant neoplasm of overlapping sites of right female breast: Secondary | ICD-10-CM | POA: Diagnosis not present

## 2024-01-27 DIAGNOSIS — Z6838 Body mass index (BMI) 38.0-38.9, adult: Secondary | ICD-10-CM | POA: Diagnosis not present

## 2024-01-27 DIAGNOSIS — Z170 Estrogen receptor positive status [ER+]: Secondary | ICD-10-CM | POA: Diagnosis not present

## 2024-01-27 DIAGNOSIS — C50411 Malignant neoplasm of upper-outer quadrant of right female breast: Secondary | ICD-10-CM | POA: Diagnosis not present

## 2024-01-27 DIAGNOSIS — Z1732 Human epidermal growth factor receptor 2 negative status: Secondary | ICD-10-CM | POA: Diagnosis not present

## 2024-01-27 DIAGNOSIS — E119 Type 2 diabetes mellitus without complications: Secondary | ICD-10-CM | POA: Diagnosis not present

## 2024-01-27 DIAGNOSIS — Z79899 Other long term (current) drug therapy: Secondary | ICD-10-CM | POA: Diagnosis not present

## 2024-01-27 DIAGNOSIS — N651 Disproportion of reconstructed breast: Secondary | ICD-10-CM | POA: Diagnosis not present

## 2024-01-27 DIAGNOSIS — I10 Essential (primary) hypertension: Secondary | ICD-10-CM | POA: Diagnosis not present

## 2024-01-27 DIAGNOSIS — C50911 Malignant neoplasm of unspecified site of right female breast: Secondary | ICD-10-CM | POA: Diagnosis not present

## 2024-01-27 DIAGNOSIS — Z1721 Progesterone receptor positive status: Secondary | ICD-10-CM | POA: Diagnosis not present

## 2024-01-27 DIAGNOSIS — Z7984 Long term (current) use of oral hypoglycemic drugs: Secondary | ICD-10-CM | POA: Diagnosis not present

## 2024-01-28 DIAGNOSIS — C50911 Malignant neoplasm of unspecified site of right female breast: Secondary | ICD-10-CM | POA: Diagnosis not present

## 2024-01-28 DIAGNOSIS — Z7984 Long term (current) use of oral hypoglycemic drugs: Secondary | ICD-10-CM | POA: Diagnosis not present

## 2024-01-28 DIAGNOSIS — Z791 Long term (current) use of non-steroidal anti-inflammatories (NSAID): Secondary | ICD-10-CM | POA: Diagnosis not present

## 2024-01-28 DIAGNOSIS — E119 Type 2 diabetes mellitus without complications: Secondary | ICD-10-CM | POA: Diagnosis not present

## 2024-01-28 DIAGNOSIS — Z79899 Other long term (current) drug therapy: Secondary | ICD-10-CM | POA: Diagnosis not present

## 2024-01-28 DIAGNOSIS — I10 Essential (primary) hypertension: Secondary | ICD-10-CM | POA: Diagnosis not present

## 2024-02-02 DIAGNOSIS — C50811 Malignant neoplasm of overlapping sites of right female breast: Secondary | ICD-10-CM | POA: Diagnosis not present

## 2024-02-02 DIAGNOSIS — Z170 Estrogen receptor positive status [ER+]: Secondary | ICD-10-CM | POA: Diagnosis not present

## 2024-02-03 DIAGNOSIS — C541 Malignant neoplasm of endometrium: Secondary | ICD-10-CM | POA: Insufficient documentation

## 2024-02-04 DIAGNOSIS — C541 Malignant neoplasm of endometrium: Secondary | ICD-10-CM | POA: Diagnosis not present

## 2024-02-04 DIAGNOSIS — Z853 Personal history of malignant neoplasm of breast: Secondary | ICD-10-CM | POA: Diagnosis not present

## 2024-02-04 DIAGNOSIS — Z79811 Long term (current) use of aromatase inhibitors: Secondary | ICD-10-CM | POA: Diagnosis not present

## 2024-02-04 DIAGNOSIS — C50919 Malignant neoplasm of unspecified site of unspecified female breast: Secondary | ICD-10-CM | POA: Diagnosis not present

## 2024-02-04 DIAGNOSIS — C50411 Malignant neoplasm of upper-outer quadrant of right female breast: Secondary | ICD-10-CM | POA: Diagnosis not present

## 2024-02-10 DIAGNOSIS — C50919 Malignant neoplasm of unspecified site of unspecified female breast: Secondary | ICD-10-CM | POA: Diagnosis not present

## 2024-02-10 DIAGNOSIS — C50811 Malignant neoplasm of overlapping sites of right female breast: Secondary | ICD-10-CM | POA: Diagnosis not present

## 2024-02-10 DIAGNOSIS — Z170 Estrogen receptor positive status [ER+]: Secondary | ICD-10-CM | POA: Diagnosis not present

## 2024-02-12 DIAGNOSIS — C50811 Malignant neoplasm of overlapping sites of right female breast: Secondary | ICD-10-CM | POA: Diagnosis not present

## 2024-02-12 DIAGNOSIS — Z170 Estrogen receptor positive status [ER+]: Secondary | ICD-10-CM | POA: Diagnosis not present

## 2024-02-18 DIAGNOSIS — C50811 Malignant neoplasm of overlapping sites of right female breast: Secondary | ICD-10-CM | POA: Diagnosis not present

## 2024-02-18 DIAGNOSIS — C50911 Malignant neoplasm of unspecified site of right female breast: Secondary | ICD-10-CM | POA: Diagnosis not present

## 2024-02-18 DIAGNOSIS — Z170 Estrogen receptor positive status [ER+]: Secondary | ICD-10-CM | POA: Diagnosis not present

## 2024-02-18 DIAGNOSIS — Z510 Encounter for antineoplastic radiation therapy: Secondary | ICD-10-CM | POA: Diagnosis not present

## 2024-02-25 DIAGNOSIS — Z510 Encounter for antineoplastic radiation therapy: Secondary | ICD-10-CM | POA: Diagnosis not present

## 2024-02-25 DIAGNOSIS — C50811 Malignant neoplasm of overlapping sites of right female breast: Secondary | ICD-10-CM | POA: Diagnosis not present

## 2024-02-25 DIAGNOSIS — Z170 Estrogen receptor positive status [ER+]: Secondary | ICD-10-CM | POA: Diagnosis not present

## 2024-02-26 DIAGNOSIS — Z510 Encounter for antineoplastic radiation therapy: Secondary | ICD-10-CM | POA: Diagnosis not present

## 2024-02-26 DIAGNOSIS — Z170 Estrogen receptor positive status [ER+]: Secondary | ICD-10-CM | POA: Diagnosis not present

## 2024-02-26 DIAGNOSIS — C50811 Malignant neoplasm of overlapping sites of right female breast: Secondary | ICD-10-CM | POA: Diagnosis not present

## 2024-02-27 DIAGNOSIS — C541 Malignant neoplasm of endometrium: Secondary | ICD-10-CM | POA: Diagnosis not present

## 2024-03-01 DIAGNOSIS — R0602 Shortness of breath: Secondary | ICD-10-CM | POA: Diagnosis not present

## 2024-03-01 DIAGNOSIS — C50411 Malignant neoplasm of upper-outer quadrant of right female breast: Secondary | ICD-10-CM | POA: Diagnosis not present

## 2024-03-01 DIAGNOSIS — Z90711 Acquired absence of uterus with remaining cervical stump: Secondary | ICD-10-CM | POA: Diagnosis not present

## 2024-03-01 DIAGNOSIS — Z8542 Personal history of malignant neoplasm of other parts of uterus: Secondary | ICD-10-CM | POA: Diagnosis not present

## 2024-03-01 DIAGNOSIS — Z9889 Other specified postprocedural states: Secondary | ICD-10-CM | POA: Diagnosis not present

## 2024-03-01 DIAGNOSIS — Z08 Encounter for follow-up examination after completed treatment for malignant neoplasm: Secondary | ICD-10-CM | POA: Diagnosis not present

## 2024-03-01 DIAGNOSIS — R42 Dizziness and giddiness: Secondary | ICD-10-CM | POA: Diagnosis not present

## 2024-03-01 DIAGNOSIS — R079 Chest pain, unspecified: Secondary | ICD-10-CM | POA: Diagnosis not present

## 2024-03-01 DIAGNOSIS — Z853 Personal history of malignant neoplasm of breast: Secondary | ICD-10-CM | POA: Diagnosis not present

## 2024-03-01 DIAGNOSIS — C541 Malignant neoplasm of endometrium: Secondary | ICD-10-CM | POA: Diagnosis not present

## 2024-03-01 DIAGNOSIS — Z86711 Personal history of pulmonary embolism: Secondary | ICD-10-CM | POA: Diagnosis not present

## 2024-03-02 DIAGNOSIS — Z170 Estrogen receptor positive status [ER+]: Secondary | ICD-10-CM | POA: Diagnosis not present

## 2024-03-02 DIAGNOSIS — C50811 Malignant neoplasm of overlapping sites of right female breast: Secondary | ICD-10-CM | POA: Diagnosis not present

## 2024-03-03 DIAGNOSIS — Z510 Encounter for antineoplastic radiation therapy: Secondary | ICD-10-CM | POA: Diagnosis not present

## 2024-03-03 DIAGNOSIS — C50811 Malignant neoplasm of overlapping sites of right female breast: Secondary | ICD-10-CM | POA: Diagnosis not present

## 2024-03-03 DIAGNOSIS — Z170 Estrogen receptor positive status [ER+]: Secondary | ICD-10-CM | POA: Diagnosis not present

## 2024-03-04 DIAGNOSIS — C50911 Malignant neoplasm of unspecified site of right female breast: Secondary | ICD-10-CM | POA: Diagnosis not present

## 2024-03-04 DIAGNOSIS — Z510 Encounter for antineoplastic radiation therapy: Secondary | ICD-10-CM | POA: Diagnosis not present

## 2024-03-04 DIAGNOSIS — Z170 Estrogen receptor positive status [ER+]: Secondary | ICD-10-CM | POA: Diagnosis not present

## 2024-03-04 DIAGNOSIS — C50811 Malignant neoplasm of overlapping sites of right female breast: Secondary | ICD-10-CM | POA: Diagnosis not present

## 2024-03-04 DIAGNOSIS — C50919 Malignant neoplasm of unspecified site of unspecified female breast: Secondary | ICD-10-CM | POA: Diagnosis not present

## 2024-03-05 DIAGNOSIS — Z170 Estrogen receptor positive status [ER+]: Secondary | ICD-10-CM | POA: Diagnosis not present

## 2024-03-05 DIAGNOSIS — C50811 Malignant neoplasm of overlapping sites of right female breast: Secondary | ICD-10-CM | POA: Diagnosis not present

## 2024-03-05 DIAGNOSIS — Z510 Encounter for antineoplastic radiation therapy: Secondary | ICD-10-CM | POA: Diagnosis not present

## 2024-03-09 DIAGNOSIS — C50811 Malignant neoplasm of overlapping sites of right female breast: Secondary | ICD-10-CM | POA: Diagnosis not present

## 2024-03-09 DIAGNOSIS — Z510 Encounter for antineoplastic radiation therapy: Secondary | ICD-10-CM | POA: Diagnosis not present

## 2024-03-09 DIAGNOSIS — Z170 Estrogen receptor positive status [ER+]: Secondary | ICD-10-CM | POA: Diagnosis not present

## 2024-03-10 DIAGNOSIS — C50811 Malignant neoplasm of overlapping sites of right female breast: Secondary | ICD-10-CM | POA: Diagnosis not present

## 2024-03-10 DIAGNOSIS — Z510 Encounter for antineoplastic radiation therapy: Secondary | ICD-10-CM | POA: Diagnosis not present

## 2024-03-10 DIAGNOSIS — Z170 Estrogen receptor positive status [ER+]: Secondary | ICD-10-CM | POA: Diagnosis not present

## 2024-03-11 ENCOUNTER — Telehealth: Payer: Self-pay | Admitting: Pharmacy Technician

## 2024-03-11 DIAGNOSIS — C50811 Malignant neoplasm of overlapping sites of right female breast: Secondary | ICD-10-CM | POA: Diagnosis not present

## 2024-03-11 DIAGNOSIS — Z510 Encounter for antineoplastic radiation therapy: Secondary | ICD-10-CM | POA: Diagnosis not present

## 2024-03-11 DIAGNOSIS — Z170 Estrogen receptor positive status [ER+]: Secondary | ICD-10-CM | POA: Diagnosis not present

## 2024-03-11 NOTE — Progress Notes (Signed)
   03/11/2024  Patient ID: Isac Maples, female   DOB: Oct 11, 1956, 68 y.o.   MRN: [id number]  Incoming call received from patient. HIPAA verified.  Patient informed she received noticed that Medicare was NOT going to cover a chemotherapy medication that she was placed on. She believed the name was "Denzel Flatness" and she was inquiring about patient assistance for that medication.  Informed patient that I would forward the message.  Reached out to Sharlyn Deaner, LCSW at [facility] Management and informed her of the above information. She informs she would outreach patient today.  Twilia Yaklin, CPhT [facility] Office: [phone] Email: [email or web address]

## 2024-03-12 DIAGNOSIS — Z170 Estrogen receptor positive status [ER+]: Secondary | ICD-10-CM | POA: Diagnosis not present

## 2024-03-12 DIAGNOSIS — Z510 Encounter for antineoplastic radiation therapy: Secondary | ICD-10-CM | POA: Diagnosis not present

## 2024-03-12 DIAGNOSIS — C50811 Malignant neoplasm of overlapping sites of right female breast: Secondary | ICD-10-CM | POA: Diagnosis not present

## 2024-03-15 DIAGNOSIS — C50811 Malignant neoplasm of overlapping sites of right female breast: Secondary | ICD-10-CM | POA: Diagnosis not present

## 2024-03-15 DIAGNOSIS — Z853 Personal history of malignant neoplasm of breast: Secondary | ICD-10-CM | POA: Diagnosis not present

## 2024-03-15 DIAGNOSIS — Z1732 Human epidermal growth factor receptor 2 negative status: Secondary | ICD-10-CM | POA: Diagnosis not present

## 2024-03-15 DIAGNOSIS — Z483 Aftercare following surgery for neoplasm: Secondary | ICD-10-CM | POA: Diagnosis not present

## 2024-03-15 DIAGNOSIS — Z1721 Progesterone receptor positive status: Secondary | ICD-10-CM | POA: Diagnosis not present

## 2024-03-15 DIAGNOSIS — C541 Malignant neoplasm of endometrium: Secondary | ICD-10-CM | POA: Diagnosis not present

## 2024-03-15 DIAGNOSIS — Z170 Estrogen receptor positive status [ER+]: Secondary | ICD-10-CM | POA: Diagnosis not present

## 2024-03-15 DIAGNOSIS — Z5189 Encounter for other specified aftercare: Secondary | ICD-10-CM | POA: Diagnosis not present

## 2024-03-15 DIAGNOSIS — C50911 Malignant neoplasm of unspecified site of right female breast: Secondary | ICD-10-CM | POA: Diagnosis not present

## 2024-03-15 DIAGNOSIS — Z510 Encounter for antineoplastic radiation therapy: Secondary | ICD-10-CM | POA: Diagnosis not present

## 2024-03-16 DIAGNOSIS — Z853 Personal history of malignant neoplasm of breast: Secondary | ICD-10-CM | POA: Diagnosis not present

## 2024-03-16 DIAGNOSIS — Z170 Estrogen receptor positive status [ER+]: Secondary | ICD-10-CM | POA: Diagnosis not present

## 2024-03-16 DIAGNOSIS — Z483 Aftercare following surgery for neoplasm: Secondary | ICD-10-CM | POA: Diagnosis not present

## 2024-03-16 DIAGNOSIS — Z5189 Encounter for other specified aftercare: Secondary | ICD-10-CM | POA: Diagnosis not present

## 2024-03-16 DIAGNOSIS — C50911 Malignant neoplasm of unspecified site of right female breast: Secondary | ICD-10-CM | POA: Diagnosis not present

## 2024-03-16 DIAGNOSIS — Z809 Family history of malignant neoplasm, unspecified: Secondary | ICD-10-CM | POA: Diagnosis not present

## 2024-03-16 DIAGNOSIS — Z510 Encounter for antineoplastic radiation therapy: Secondary | ICD-10-CM | POA: Diagnosis not present

## 2024-03-16 DIAGNOSIS — Z1721 Progesterone receptor positive status: Secondary | ICD-10-CM | POA: Diagnosis not present

## 2024-03-16 DIAGNOSIS — C50811 Malignant neoplasm of overlapping sites of right female breast: Secondary | ICD-10-CM | POA: Diagnosis not present

## 2024-03-16 DIAGNOSIS — C541 Malignant neoplasm of endometrium: Secondary | ICD-10-CM | POA: Diagnosis not present

## 2024-03-16 DIAGNOSIS — Z1732 Human epidermal growth factor receptor 2 negative status: Secondary | ICD-10-CM | POA: Diagnosis not present

## 2024-03-17 DIAGNOSIS — C541 Malignant neoplasm of endometrium: Secondary | ICD-10-CM | POA: Diagnosis not present

## 2024-03-17 DIAGNOSIS — Z483 Aftercare following surgery for neoplasm: Secondary | ICD-10-CM | POA: Diagnosis not present

## 2024-03-17 DIAGNOSIS — C50911 Malignant neoplasm of unspecified site of right female breast: Secondary | ICD-10-CM | POA: Diagnosis not present

## 2024-03-17 DIAGNOSIS — E119 Type 2 diabetes mellitus without complications: Secondary | ICD-10-CM | POA: Diagnosis not present

## 2024-03-17 DIAGNOSIS — H2513 Age-related nuclear cataract, bilateral: Secondary | ICD-10-CM | POA: Diagnosis not present

## 2024-03-17 DIAGNOSIS — Z5189 Encounter for other specified aftercare: Secondary | ICD-10-CM | POA: Diagnosis not present

## 2024-03-17 DIAGNOSIS — Z1732 Human epidermal growth factor receptor 2 negative status: Secondary | ICD-10-CM | POA: Diagnosis not present

## 2024-03-17 DIAGNOSIS — Z1721 Progesterone receptor positive status: Secondary | ICD-10-CM | POA: Diagnosis not present

## 2024-03-17 DIAGNOSIS — H25013 Cortical age-related cataract, bilateral: Secondary | ICD-10-CM | POA: Diagnosis not present

## 2024-03-17 DIAGNOSIS — Z170 Estrogen receptor positive status [ER+]: Secondary | ICD-10-CM | POA: Diagnosis not present

## 2024-03-17 DIAGNOSIS — Z510 Encounter for antineoplastic radiation therapy: Secondary | ICD-10-CM | POA: Diagnosis not present

## 2024-03-17 DIAGNOSIS — C50811 Malignant neoplasm of overlapping sites of right female breast: Secondary | ICD-10-CM | POA: Diagnosis not present

## 2024-03-17 DIAGNOSIS — H04123 Dry eye syndrome of bilateral lacrimal glands: Secondary | ICD-10-CM | POA: Diagnosis not present

## 2024-03-17 DIAGNOSIS — Z853 Personal history of malignant neoplasm of breast: Secondary | ICD-10-CM | POA: Diagnosis not present

## 2024-03-18 DIAGNOSIS — C541 Malignant neoplasm of endometrium: Secondary | ICD-10-CM | POA: Diagnosis not present

## 2024-03-18 DIAGNOSIS — Z1721 Progesterone receptor positive status: Secondary | ICD-10-CM | POA: Diagnosis not present

## 2024-03-18 DIAGNOSIS — Z1732 Human epidermal growth factor receptor 2 negative status: Secondary | ICD-10-CM | POA: Diagnosis not present

## 2024-03-18 DIAGNOSIS — Z510 Encounter for antineoplastic radiation therapy: Secondary | ICD-10-CM | POA: Diagnosis not present

## 2024-03-18 DIAGNOSIS — Z170 Estrogen receptor positive status [ER+]: Secondary | ICD-10-CM | POA: Diagnosis not present

## 2024-03-18 DIAGNOSIS — Z5189 Encounter for other specified aftercare: Secondary | ICD-10-CM | POA: Diagnosis not present

## 2024-03-18 DIAGNOSIS — Z483 Aftercare following surgery for neoplasm: Secondary | ICD-10-CM | POA: Diagnosis not present

## 2024-03-18 DIAGNOSIS — C50811 Malignant neoplasm of overlapping sites of right female breast: Secondary | ICD-10-CM | POA: Diagnosis not present

## 2024-03-18 DIAGNOSIS — Z853 Personal history of malignant neoplasm of breast: Secondary | ICD-10-CM | POA: Diagnosis not present

## 2024-03-18 DIAGNOSIS — C50911 Malignant neoplasm of unspecified site of right female breast: Secondary | ICD-10-CM | POA: Diagnosis not present

## 2024-03-19 DIAGNOSIS — Z1721 Progesterone receptor positive status: Secondary | ICD-10-CM | POA: Diagnosis not present

## 2024-03-19 DIAGNOSIS — Z170 Estrogen receptor positive status [ER+]: Secondary | ICD-10-CM | POA: Diagnosis not present

## 2024-03-19 DIAGNOSIS — Z853 Personal history of malignant neoplasm of breast: Secondary | ICD-10-CM | POA: Diagnosis not present

## 2024-03-19 DIAGNOSIS — C541 Malignant neoplasm of endometrium: Secondary | ICD-10-CM | POA: Diagnosis not present

## 2024-03-19 DIAGNOSIS — C50811 Malignant neoplasm of overlapping sites of right female breast: Secondary | ICD-10-CM | POA: Diagnosis not present

## 2024-03-19 DIAGNOSIS — Z510 Encounter for antineoplastic radiation therapy: Secondary | ICD-10-CM | POA: Diagnosis not present

## 2024-03-19 DIAGNOSIS — Z483 Aftercare following surgery for neoplasm: Secondary | ICD-10-CM | POA: Diagnosis not present

## 2024-03-19 DIAGNOSIS — Z1732 Human epidermal growth factor receptor 2 negative status: Secondary | ICD-10-CM | POA: Diagnosis not present

## 2024-03-19 DIAGNOSIS — C50911 Malignant neoplasm of unspecified site of right female breast: Secondary | ICD-10-CM | POA: Diagnosis not present

## 2024-03-19 DIAGNOSIS — Z5189 Encounter for other specified aftercare: Secondary | ICD-10-CM | POA: Diagnosis not present

## 2024-03-22 DIAGNOSIS — Z483 Aftercare following surgery for neoplasm: Secondary | ICD-10-CM | POA: Diagnosis not present

## 2024-03-22 DIAGNOSIS — E1140 Type 2 diabetes mellitus with diabetic neuropathy, unspecified: Secondary | ICD-10-CM | POA: Diagnosis not present

## 2024-03-22 DIAGNOSIS — Z5189 Encounter for other specified aftercare: Secondary | ICD-10-CM | POA: Diagnosis not present

## 2024-03-22 DIAGNOSIS — Z510 Encounter for antineoplastic radiation therapy: Secondary | ICD-10-CM | POA: Diagnosis not present

## 2024-03-22 DIAGNOSIS — Z1721 Progesterone receptor positive status: Secondary | ICD-10-CM | POA: Diagnosis not present

## 2024-03-22 DIAGNOSIS — C50811 Malignant neoplasm of overlapping sites of right female breast: Secondary | ICD-10-CM | POA: Diagnosis not present

## 2024-03-22 DIAGNOSIS — Z853 Personal history of malignant neoplasm of breast: Secondary | ICD-10-CM | POA: Diagnosis not present

## 2024-03-22 DIAGNOSIS — C541 Malignant neoplasm of endometrium: Secondary | ICD-10-CM | POA: Diagnosis not present

## 2024-03-22 DIAGNOSIS — Z170 Estrogen receptor positive status [ER+]: Secondary | ICD-10-CM | POA: Diagnosis not present

## 2024-03-22 DIAGNOSIS — M4726 Other spondylosis with radiculopathy, lumbar region: Secondary | ICD-10-CM | POA: Diagnosis not present

## 2024-03-22 DIAGNOSIS — Z1732 Human epidermal growth factor receptor 2 negative status: Secondary | ICD-10-CM | POA: Diagnosis not present

## 2024-03-22 DIAGNOSIS — C50911 Malignant neoplasm of unspecified site of right female breast: Secondary | ICD-10-CM | POA: Diagnosis not present

## 2024-03-23 DIAGNOSIS — C50811 Malignant neoplasm of overlapping sites of right female breast: Secondary | ICD-10-CM | POA: Diagnosis not present

## 2024-03-23 DIAGNOSIS — C50911 Malignant neoplasm of unspecified site of right female breast: Secondary | ICD-10-CM | POA: Diagnosis not present

## 2024-03-23 DIAGNOSIS — Z1721 Progesterone receptor positive status: Secondary | ICD-10-CM | POA: Diagnosis not present

## 2024-03-23 DIAGNOSIS — Z483 Aftercare following surgery for neoplasm: Secondary | ICD-10-CM | POA: Diagnosis not present

## 2024-03-23 DIAGNOSIS — Z853 Personal history of malignant neoplasm of breast: Secondary | ICD-10-CM | POA: Diagnosis not present

## 2024-03-23 DIAGNOSIS — C541 Malignant neoplasm of endometrium: Secondary | ICD-10-CM | POA: Diagnosis not present

## 2024-03-23 DIAGNOSIS — Z5189 Encounter for other specified aftercare: Secondary | ICD-10-CM | POA: Diagnosis not present

## 2024-03-23 DIAGNOSIS — Z170 Estrogen receptor positive status [ER+]: Secondary | ICD-10-CM | POA: Diagnosis not present

## 2024-03-23 DIAGNOSIS — Z510 Encounter for antineoplastic radiation therapy: Secondary | ICD-10-CM | POA: Diagnosis not present

## 2024-03-23 DIAGNOSIS — Z1732 Human epidermal growth factor receptor 2 negative status: Secondary | ICD-10-CM | POA: Diagnosis not present

## 2024-03-24 DIAGNOSIS — Z483 Aftercare following surgery for neoplasm: Secondary | ICD-10-CM | POA: Diagnosis not present

## 2024-03-24 DIAGNOSIS — Z510 Encounter for antineoplastic radiation therapy: Secondary | ICD-10-CM | POA: Diagnosis not present

## 2024-03-24 DIAGNOSIS — C50811 Malignant neoplasm of overlapping sites of right female breast: Secondary | ICD-10-CM | POA: Diagnosis not present

## 2024-03-24 DIAGNOSIS — Z5189 Encounter for other specified aftercare: Secondary | ICD-10-CM | POA: Diagnosis not present

## 2024-03-24 DIAGNOSIS — C50911 Malignant neoplasm of unspecified site of right female breast: Secondary | ICD-10-CM | POA: Diagnosis not present

## 2024-03-24 DIAGNOSIS — Z1732 Human epidermal growth factor receptor 2 negative status: Secondary | ICD-10-CM | POA: Diagnosis not present

## 2024-03-24 DIAGNOSIS — C541 Malignant neoplasm of endometrium: Secondary | ICD-10-CM | POA: Diagnosis not present

## 2024-03-24 DIAGNOSIS — Z170 Estrogen receptor positive status [ER+]: Secondary | ICD-10-CM | POA: Diagnosis not present

## 2024-03-24 DIAGNOSIS — Z853 Personal history of malignant neoplasm of breast: Secondary | ICD-10-CM | POA: Diagnosis not present

## 2024-03-24 DIAGNOSIS — Z1721 Progesterone receptor positive status: Secondary | ICD-10-CM | POA: Diagnosis not present

## 2024-04-05 DIAGNOSIS — N61 Mastitis without abscess: Secondary | ICD-10-CM | POA: Diagnosis not present

## 2024-04-05 DIAGNOSIS — C50811 Malignant neoplasm of overlapping sites of right female breast: Secondary | ICD-10-CM | POA: Diagnosis not present

## 2024-04-05 DIAGNOSIS — Z170 Estrogen receptor positive status [ER+]: Secondary | ICD-10-CM | POA: Diagnosis not present

## 2024-04-15 DIAGNOSIS — Z170 Estrogen receptor positive status [ER+]: Secondary | ICD-10-CM | POA: Diagnosis not present

## 2024-04-15 DIAGNOSIS — C50811 Malignant neoplasm of overlapping sites of right female breast: Secondary | ICD-10-CM | POA: Diagnosis not present

## 2024-04-20 DIAGNOSIS — C50811 Malignant neoplasm of overlapping sites of right female breast: Secondary | ICD-10-CM | POA: Diagnosis not present

## 2024-04-20 DIAGNOSIS — Z170 Estrogen receptor positive status [ER+]: Secondary | ICD-10-CM | POA: Diagnosis not present

## 2024-05-04 DIAGNOSIS — Z79811 Long term (current) use of aromatase inhibitors: Secondary | ICD-10-CM | POA: Diagnosis not present

## 2024-05-04 DIAGNOSIS — C50919 Malignant neoplasm of unspecified site of unspecified female breast: Secondary | ICD-10-CM | POA: Diagnosis not present

## 2024-06-04 DIAGNOSIS — E1140 Type 2 diabetes mellitus with diabetic neuropathy, unspecified: Secondary | ICD-10-CM | POA: Diagnosis not present

## 2024-06-21 DIAGNOSIS — M5416 Radiculopathy, lumbar region: Secondary | ICD-10-CM | POA: Diagnosis not present

## 2024-06-21 DIAGNOSIS — E1140 Type 2 diabetes mellitus with diabetic neuropathy, unspecified: Secondary | ICD-10-CM | POA: Diagnosis not present

## 2024-06-23 ENCOUNTER — Inpatient Hospital Stay
Admission: RE | Admit: 2024-06-23 | Discharge: 2024-06-23 | Disposition: A | Discharge status: Home / Self Care | Payer: Self-pay | Source: Ambulatory Visit | Attending: Orthopedic Surgery | Admitting: Orthopedic Surgery

## 2024-06-23 ENCOUNTER — Other Ambulatory Visit: Payer: Self-pay | Admitting: Family Medicine

## 2024-06-23 DIAGNOSIS — Z049 Encounter for examination and observation for unspecified reason: Secondary | ICD-10-CM

## 2024-07-01 DIAGNOSIS — M79604 Pain in right leg: Secondary | ICD-10-CM | POA: Diagnosis not present

## 2024-07-15 NOTE — Progress Notes (Deleted)
 Referring Physician:  Sherial Bail, MD [address] [place],  [place] [address]  Primary Physician:  Sherial Bail, MD  History of Present Illness: 07/15/2024*** [name] has a history of HTN, DM, endometrial CA, breast CA.   She has been seeing [facility] spine/PMR- last seen on 07/01/24 and they recommended a lower extremity EMG to evaluate for lumbar radiculopathy versus saphenous mononeuropathy.      Low back pain that radiates down both legs to the feet causing tingling and burning.  Numbness and tingling worse in the right foot  Duration: *** Location: *** Quality: *** Severity: ***  Precipitating: aggravated by *** Modifying factors: made better by *** Weakness: none Timing: ***  Tobacco use: Does not smoke.   Bowel/Bladder Dysfunction: none  Conservative measures:  Physical therapy: *** has not participated in recently-last PT was 2023?? Multimodal medical therapy including regular antiinflammatories: *** Gabapentin, Methocarbamol, Voltaren Gel Injections:  01/05/2024- Radiofrequency ablation of right L3, L4, L5  11/20/2023- Lumbar Medial Branch Blocks L4-L5, L5-S1  03/19/2023- right lumbar medial branch block L4, L5, SA  09/19/2022- right L4-5 and L5-S1 facet injection   Past Surgery: ***no spine surgery  [name] has ***no symptoms of cervical myelopathy.  The symptoms are causing a significant impact on the patient's life.   Review of Systems:  A 10 point review of systems is negative, except for the pertinent positives and negatives detailed in the HPI.  Past Medical History: Past Medical History:  Diagnosis Date   Cancer [facility])     Past Surgical History: Past Surgical History:  Procedure Laterality Date   BREAST LUMPECTOMY Right     Allergies: Allergies as of 07/20/2024   (No Known Allergies)    Medications: Outpatient Encounter Medications as of 07/20/2024  Medication Sig   albuterol (VENTOLIN HFA) 108 (90 Base)  MCG/ACT inhaler Inhale 2 puffs into the lungs every 6 (six) hours as needed.   atorvastatin (LIPITOR) 20 MG tablet Take 20 mg by mouth daily.   Cyanocobalamin 2500 MCG CHEW Chew 1 tablet by mouth daily.   Fluticasone-Umeclidin-Vilant (TRELEGY ELLIPTA) 100-62.5-25 MCG/ACT AEPB Inhale 1 puff into the lungs daily.   gabapentin (NEURONTIN) 100 MG capsule Take 100 mg by mouth 3 (three) times daily.   gabapentin (NEURONTIN) 300 MG capsule Take 300 mg by mouth 3 (three) times daily.   glipiZIDE (GLUCOTROL XL) 5 MG 24 hr tablet Take 5 mg by mouth daily.   magnesium oxide (MAG-OX) 400 MG tablet Take 400 mg by mouth daily.   meclizine  (ANTIVERT ) 25 MG tablet Take 1 tablet (25 mg total) by mouth 3 (three) times daily as needed for dizziness.   metFORMIN (GLUCOPHAGE-XR) 500 MG 24 hr tablet Take 2 tablets by mouth daily with supper.   methocarbamol (ROBAXIN) 500 MG tablet Take 500 mg by mouth 2 (two) times daily as needed.   metoprolol tartrate (LOPRESSOR) 25 MG tablet Take 1 tablet by mouth 2 (two) times daily.   omeprazole (PRILOSEC) 20 MG capsule Take 20 mg by mouth daily.   Semaglutide,0.25 or 0.5MG /DOS, 2 MG/3ML SOPN Inject 0.5 mg into the skin once a week.   No facility-administered encounter medications on file as of 07/20/2024.    Social History: Social History   Tobacco Use   Smoking status: Never   Smokeless tobacco: Never  Substance Use Topics   Alcohol use: Not Currently   Drug use: Not Currently    Family Medical History: No family history on file.  Physical Examination: There  were no vitals filed for this visit.  General: Patient is well developed, well nourished, calm, collected, and in no apparent distress. Attention to examination is appropriate.  Respiratory: Patient is breathing without any difficulty.   NEUROLOGICAL:     Awake, alert, oriented to person, place, and time.  Speech is clear and fluent. Fund of knowledge is appropriate.   Cranial Nerves: Pupils equal  round and reactive to light.  Facial tone is symmetric.    *** ROM of cervical spine *** pain *** posterior cervical tenderness. *** tenderness in bilateral trapezial region.   *** ROM of lumbar spine *** pain *** posterior lumbar tenderness.   No abnormal lesions on exposed skin.   Strength: Side Biceps Triceps Deltoid Interossei Grip Wrist Ext. Wrist Flex.  R 5 5 5 5 5 5 5   L 5 5 5 5 5 5 5    Side Iliopsoas Quads Hamstring PF DF EHL  R 5 5 5 5 5 5   L 5 5 5 5 5 5    Reflexes are ***2+ and symmetric at the biceps, brachioradialis, patella and achilles.   Hoffman's is absent.  Clonus is not present.   Bilateral upper and lower extremity sensation is intact to light touch.     Gait is normal.   ***No difficulty with tandem gait.    Medical Decision Making  Imaging: Lumbar MRI dated 09/01/23:  FINDINGS:  Heterogeneous bone marrow signal intensity, likely degenerative. No suspicious bone marrow signal abnormalities.  Multilevel loss of intervertebral disc height and signal secondary to degenerative change, most severe between L2 and S1.  The spinal cord terminates normally at the L1 level. No spinal cord signal abnormalities.  There is straightening of the lumbar spine. No significant malalignment.  T12-L1: Mild osteophytic ridging and small disc bulge resulting in mild spinal canal narrowing. Facet arthrosis with mild neural foraminal narrowing.  L1-L2: Bulging disc and thickening of the ligamenta flava resulting in mild spinal canal narrowing. Mild bilateral facet arthrosis with moderate neural foraminal narrowing bilaterally.  L2-L3: Diffuse bulging disc and facet arthrosis with thickening of the ligamenta flava resulting in severe spinal canal narrowing with crowding/compression of the traversing nerve roots, along with moderate to severe neural foraminal narrowing bilaterally.  L3-L4: Diffuse bulging disc and facet arthrosis with thickening of the ligamenta flava results  in severe spinal canal narrowing with crowding/compression of the traversing nerve roots, along with moderate to severe neural foraminal narrowing bilaterally.  L4-L5: Bulging disc and small central disc protrusion along with small endplate osteophytes resulting in mild spinal canal narrowing. Mild neural foraminal narrowing with thickening of the ligamenta flava resulting in moderate to severe neural foraminal narrowing bilaterally.  L5-S1: Mild bulging disc and osteophytic ridging, along with facet arthrosis resulting in mild to moderate neural foraminal narrowing. No significant spinal canal narrowing.  There is mild to moderate atrophy of the paraspinal musculature.  IMPRESSION:  No significant interval change compared with 05/06/2022.  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 Exam End: 09/01/23 15:24     I have personally reviewed the images and agree with the above interpretation.  Assessment and Plan: Ms. Berringer is a pleasant 68 y.o. female has ***  Treatment options discussed with patient and following plan made:   - Order for physical therapy for *** spine ***. Patient to call to schedule appointment. *** - Continue current medications including ***. Reviewed dosing and  side effects.  - Prescription for ***. Reviewed dosing and side effects. Take with food.  - Prescription for *** to take prn muscle spasms. Reviewed dosing and side effects. Discussed this can cause drowsiness.  - MRI of *** to further evaluate *** radiculopathy. No improvement time or medications (***).  - Referral to [facility] to discuss possible *** injections.  - Will schedule phone visit to review MRI results once I get them back.   I spent a total of *** minutes in face-to-face and non-face-to-face activities related to this patient's care today including review of outside records,  review of imaging, review of symptoms, physical exam, discussion of differential diagnosis, discussion of treatment options, and documentation.   Thank you for involving me in the care of this patient.   Glade Boys PA-C Dept. of Neurosurgery

## 2024-07-16 ENCOUNTER — Encounter: Payer: Self-pay | Admitting: Orthopedic Surgery

## 2024-07-20 ENCOUNTER — Ambulatory Visit: Admitting: Orthopedic Surgery

## 2024-07-20 DIAGNOSIS — E1142 Type 2 diabetes mellitus with diabetic polyneuropathy: Secondary | ICD-10-CM | POA: Diagnosis not present

## 2024-07-20 DIAGNOSIS — G629 Polyneuropathy, unspecified: Secondary | ICD-10-CM | POA: Diagnosis not present

## 2024-08-06 DIAGNOSIS — Z96652 Presence of left artificial knee joint: Secondary | ICD-10-CM | POA: Diagnosis not present

## 2024-08-06 DIAGNOSIS — M25561 Pain in right knee: Secondary | ICD-10-CM | POA: Diagnosis not present

## 2024-08-06 DIAGNOSIS — Z96653 Presence of artificial knee joint, bilateral: Secondary | ICD-10-CM | POA: Diagnosis not present

## 2024-08-06 DIAGNOSIS — Z96651 Presence of right artificial knee joint: Secondary | ICD-10-CM | POA: Diagnosis not present

## 2024-08-19 NOTE — Progress Notes (Signed)
 Krista Collins                                          MRN: [id number]   08/19/2024   The [facility] Quality Team Specialist reviewed this patient medical record for the purposes of chart review for care gap closure. The following were reviewed: abstraction for care gap closure-controlling blood pressure.    [facility] Quality Team

## 2024-08-19 NOTE — Progress Notes (Deleted)
 Krista Collins                                          MRN: [id number]   08/19/2024   The [facility] Quality Team Specialist reviewed this patient medical record for the purposes of chart review for care gap closure. The following were reviewed: abstraction for care gap closure-controlling blood pressure. Chart reviewed also for KED labs    [facility] Quality Team

## 2024-08-25 DIAGNOSIS — C541 Malignant neoplasm of endometrium: Secondary | ICD-10-CM | POA: Diagnosis not present

## 2024-08-30 DIAGNOSIS — M5410 Radiculopathy, site unspecified: Secondary | ICD-10-CM | POA: Diagnosis not present

## 2024-09-02 DIAGNOSIS — Z08 Encounter for follow-up examination after completed treatment for malignant neoplasm: Secondary | ICD-10-CM | POA: Diagnosis not present

## 2024-09-02 DIAGNOSIS — C50811 Malignant neoplasm of overlapping sites of right female breast: Secondary | ICD-10-CM | POA: Diagnosis not present

## 2024-09-02 DIAGNOSIS — Z170 Estrogen receptor positive status [ER+]: Secondary | ICD-10-CM | POA: Diagnosis not present

## 2024-09-02 DIAGNOSIS — Z853 Personal history of malignant neoplasm of breast: Secondary | ICD-10-CM | POA: Diagnosis not present
